# Patient Record
Sex: Male | Born: 1975 | Race: White | Hispanic: No | Marital: Married | Smoking: Never smoker
Health system: Southern US, Community
[De-identification: ages and names within clinical notes are randomized; demographics above are authoritative.]

## PROBLEM LIST (undated history)

## (undated) DIAGNOSIS — R011 Cardiac murmur, unspecified: Secondary | ICD-10-CM

## (undated) DIAGNOSIS — Z87442 Personal history of urinary calculi: Secondary | ICD-10-CM

## (undated) HISTORY — PX: BICEPS TENDON REPAIR: SHX566

---

## 2014-01-08 ENCOUNTER — Other Ambulatory Visit: Payer: Self-pay | Admitting: Sports Medicine

## 2014-01-08 DIAGNOSIS — S53499A Other sprain of unspecified elbow, initial encounter: Secondary | ICD-10-CM

## 2014-01-08 DIAGNOSIS — S56819A Strain of other muscles, fascia and tendons at forearm level, unspecified arm, initial encounter: Principal | ICD-10-CM

## 2014-01-11 ENCOUNTER — Ambulatory Visit
Admission: RE | Admit: 2014-01-11 | Discharge: 2014-01-11 | Disposition: A | Discharge status: Home / Self Care | Payer: 59 | Source: Ambulatory Visit | Attending: Sports Medicine | Admitting: Sports Medicine

## 2014-01-11 DIAGNOSIS — S56819A Strain of other muscles, fascia and tendons at forearm level, unspecified arm, initial encounter: Principal | ICD-10-CM

## 2014-01-11 DIAGNOSIS — S53499A Other sprain of unspecified elbow, initial encounter: Secondary | ICD-10-CM

## 2020-12-21 ENCOUNTER — Other Ambulatory Visit: Payer: Self-pay

## 2020-12-21 ENCOUNTER — Emergency Department (HOSPITAL_BASED_OUTPATIENT_CLINIC_OR_DEPARTMENT_OTHER): Payer: BC Managed Care – PPO

## 2020-12-21 ENCOUNTER — Emergency Department (HOSPITAL_BASED_OUTPATIENT_CLINIC_OR_DEPARTMENT_OTHER)
Admission: EM | Admit: 2020-12-21 | Discharge: 2020-12-22 | Disposition: A | Discharge status: Home / Self Care | Payer: BC Managed Care – PPO | Attending: Emergency Medicine | Admitting: Emergency Medicine

## 2020-12-21 ENCOUNTER — Encounter (HOSPITAL_BASED_OUTPATIENT_CLINIC_OR_DEPARTMENT_OTHER): Payer: Self-pay | Admitting: Urology

## 2020-12-21 DIAGNOSIS — Y92410 Unspecified street and highway as the place of occurrence of the external cause: Secondary | ICD-10-CM | POA: Insufficient documentation

## 2020-12-21 DIAGNOSIS — M546 Pain in thoracic spine: Secondary | ICD-10-CM | POA: Insufficient documentation

## 2020-12-21 DIAGNOSIS — M549 Dorsalgia, unspecified: Secondary | ICD-10-CM

## 2020-12-21 LAB — CBC
HCT: 46.6 % (ref 39.0–52.0)
Hemoglobin: 15.6 g/dL (ref 13.0–17.0)
MCH: 28.6 pg (ref 26.0–34.0)
MCHC: 33.5 g/dL (ref 30.0–36.0)
MCV: 85.3 fL (ref 80.0–100.0)
Platelets: 309 10*3/uL (ref 150–400)
RBC: 5.46 MIL/uL (ref 4.22–5.81)
RDW: 13.2 % (ref 11.5–15.5)
WBC: 10.2 10*3/uL (ref 4.0–10.5)
nRBC: 0 % (ref 0.0–0.2)

## 2020-12-21 LAB — COMPREHENSIVE METABOLIC PANEL
ALT: 36 U/L (ref 0–44)
AST: 31 U/L (ref 15–41)
Albumin: 4.5 g/dL (ref 3.5–5.0)
Alkaline Phosphatase: 84 U/L (ref 38–126)
Anion gap: 10 (ref 5–15)
BUN: 13 mg/dL (ref 6–20)
CO2: 23 mmol/L (ref 22–32)
Calcium: 9.3 mg/dL (ref 8.9–10.3)
Chloride: 104 mmol/L (ref 98–111)
Creatinine, Ser: 1.02 mg/dL (ref 0.61–1.24)
GFR, Estimated: >60 mL/min (ref >60)
Glucose, Bld: 94 mg/dL (ref 70–99)
Potassium: 4 mmol/L (ref 3.5–5.1)
Sodium: 137 mmol/L (ref 135–145)
Total Bilirubin: 0.7 mg/dL (ref 0.3–1.2)
Total Protein: 8.2 g/dL — ABNORMAL HIGH (ref 6.5–8.1)

## 2020-12-21 LAB — URINALYSIS, ROUTINE W REFLEX MICROSCOPIC
Bilirubin Urine: NEGATIVE
Glucose, UA: NEGATIVE mg/dL
Hgb urine dipstick: NEGATIVE
Ketones, ur: NEGATIVE mg/dL
Leukocytes,Ua: NEGATIVE
Nitrite: NEGATIVE
Protein, ur: NEGATIVE mg/dL
Specific Gravity, Urine: 1.025 (ref 1.005–1.030)
pH: 6 (ref 5.0–8.0)

## 2020-12-21 MED ORDER — HYDROCODONE-ACETAMINOPHEN 5-325 MG PO TABS: 2.0000 | ORAL_TABLET | ORAL | 0 refills | Status: AC | PRN

## 2020-12-21 MED ORDER — KETOROLAC TROMETHAMINE 15 MG/ML IJ SOLN
15.0000 mg | Freq: Once | INTRAMUSCULAR | Status: AC
  Administered 2020-12-21: 15 mg via INTRAMUSCULAR
  Filled 2020-12-21: qty 1

## 2020-12-21 MED ORDER — ONDANSETRON HCL 4 MG/2ML IJ SOLN
4.0000 mg | Freq: Once | INTRAMUSCULAR | Status: AC
  Administered 2020-12-21: 4 mg via INTRAVENOUS
  Filled 2020-12-21: qty 2

## 2020-12-21 MED ORDER — CYCLOBENZAPRINE HCL 10 MG PO TABS: 10.0000 mg | ORAL_TABLET | Freq: Three times a day (TID) | ORAL | 0 refills | Status: AC | PRN

## 2020-12-21 MED ORDER — HYDROCODONE-ACETAMINOPHEN 5-325 MG PO TABS
1.0000 | ORAL_TABLET | Freq: Once | ORAL | Status: AC
Start: 2020-12-21 — End: 2020-12-21
  Administered 2020-12-21: 1 via ORAL
  Filled 2020-12-21: qty 1

## 2020-12-21 MED ORDER — MORPHINE SULFATE (PF) 4 MG/ML IV SOLN
4.0000 mg | Freq: Once | INTRAVENOUS | Status: AC
Start: 2020-12-21 — End: 2020-12-21
  Administered 2020-12-21: 4 mg via INTRAVENOUS
  Filled 2020-12-21: qty 1

## 2020-12-21 MED ORDER — IOHEXOL 300 MG/ML  SOLN
75.0000 mL | Freq: Once | INTRAMUSCULAR | Status: AC | PRN
  Administered 2020-12-21: 75 mL via INTRAVENOUS

## 2020-12-21 MED ORDER — DIAZEPAM 5 MG PO TABS
5.0000 mg | ORAL_TABLET | Freq: Once | ORAL | Status: AC
  Administered 2020-12-21: 5 mg via ORAL
  Filled 2020-12-21: qty 1
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
## 2020-12-21 NOTE — Progress Notes (Signed)
Orthopedic Tech Progress Note Patient Details:  Crue Otero Sep 23, 1975 [id number]  Patient ID: Lajuana Matte, male   DOB: 1975-08-25, 45 y.o.   MRN: [id number] I called an order into [facility]. Trinna Post 12/21/2020, 11:06 PM
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
## 2020-12-22 LAB — CK TOTAL AND CKMB (NOT AT ARMC)
CK, MB: 3.2 ng/mL (ref 0.5–5.0)
Relative Index: 0.7 (ref 0.0–2.5)
Total CK: 465 U/L — ABNORMAL HIGH (ref 49–397)

## 2020-12-22 NOTE — ED Notes (Signed)
Brace applied by specialty ortho tech. Provider informed.
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
## 2021-02-11 ENCOUNTER — Encounter: Payer: Self-pay | Admitting: Physical Therapy

## 2021-02-11 ENCOUNTER — Other Ambulatory Visit: Payer: Self-pay

## 2021-02-11 ENCOUNTER — Ambulatory Visit: Payer: BC Managed Care – PPO | Attending: Neurological Surgery | Admitting: Physical Therapy

## 2021-02-11 DIAGNOSIS — M6281 Muscle weakness (generalized): Secondary | ICD-10-CM | POA: Diagnosis present

## 2021-02-11 DIAGNOSIS — M546 Pain in thoracic spine: Secondary | ICD-10-CM | POA: Diagnosis present

## 2021-02-11 DIAGNOSIS — R293 Abnormal posture: Secondary | ICD-10-CM

## 2021-02-11 DIAGNOSIS — M62830 Muscle spasm of back: Secondary | ICD-10-CM

## 2021-02-11 DIAGNOSIS — R2689 Other abnormalities of gait and mobility: Secondary | ICD-10-CM
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
## 2021-02-11 NOTE — Patient Instructions (Signed)
Access Code: [email or web address] URL: [email or web address] Date: 02/11/2021 Prepared by: Harrie Foreman  Exercises Supine Posterior Pelvic Tilt - 1 x daily - 7 x weekly - 2 sets - 10 reps Seated Scapular Retraction - 3 x daily - 7 x weekly - 1 sets - 10 reps Supine Scapular Protraction in Flexion with Dumbbells - 1 x daily - 7 x weekly - 2 sets - 10 reps Shoulder External Rotation and Scapular Retraction with Resistance - 1 x daily - 7 x weekly - 2 sets - 10 reps

## 2021-02-24 ENCOUNTER — Ambulatory Visit: Payer: BC Managed Care – PPO

## 2021-02-24 ENCOUNTER — Other Ambulatory Visit: Payer: Self-pay

## 2021-02-24 DIAGNOSIS — M546 Pain in thoracic spine: Secondary | ICD-10-CM

## 2021-02-24 DIAGNOSIS — M6281 Muscle weakness (generalized): Secondary | ICD-10-CM

## 2021-02-24 DIAGNOSIS — M62830 Muscle spasm of back: Secondary | ICD-10-CM

## 2021-02-24 DIAGNOSIS — R2689 Other abnormalities of gait and mobility: Secondary | ICD-10-CM

## 2021-02-24 DIAGNOSIS — R293 Abnormal posture: Secondary | ICD-10-CM

## 2021-02-24 NOTE — Therapy (Signed)
[facility] Outpatient Rehabilitation [facility] [address]  [address] [place], [place], [address] Phone: [phone]   Fax:  [phone]  Physical Therapy Treatment  Patient Details  Name: Kyle Eaton MRN: [id number] Date of Birth: 1975/12/14 Referring Provider (PT): Autumn Patty MD   Encounter Date: 02/24/2021   PT End of Session - 02/24/21 0937     Visit Number 2    Number of Visits 12    Date for PT Re-Evaluation 03/25/21    Progress Note Due on Visit 10    PT Start Time 0848    PT Stop Time 0928    PT Time Calculation (min) 40 min    Activity Tolerance Patient tolerated treatment well;Patient limited by pain    Behavior During Therapy [facility] for tasks assessed/performed             History reviewed. No pertinent past medical history.  Past Surgical History:  Procedure Laterality Date   BICEPS TENDON REPAIR      There were no vitals filed for this visit.   Subjective Assessment - 02/24/21 0851     Subjective "Low back muscles are continuously sore from not being used".    Diagnostic tests CT    Patient Stated Goals "full recovery, no long term disability"    Currently in Pain? Yes    Pain Score 3     Pain Location Back    Pain Orientation Mid;Right    Pain Descriptors / Indicators Aching;Constant;Sore    Pain Type Acute pain                               [facility] Adult PT Treatment/Exercise - 02/24/21 0001       Exercises   Exercises Lumbar      Lumbar Exercises: Stretches   Active Hamstring Stretch Right;Left;30 seconds    Active Hamstring Stretch Limitations seated    Piriformis Stretch Right;Left;30 seconds    Piriformis Stretch Limitations seated KTOS    Other Lumbar Stretch Exercise fwd rollouts with green pball 10x3"      Lumbar Exercises: Aerobic   Recumbent Bike L1x45min      Lumbar Exercises: Standing   Row Strengthening;Both;Theraband;20 reps    Theraband Level (Row) Level 2 (Red)    Row  Limitations cues for scap retraction    Shoulder Extension Strengthening;Both;20 reps;Theraband    Theraband Level (Shoulder Extension) Level 2 (Red)    Other Standing Lumbar Exercises resisted trunk rotations with red TB 2x10 B    Other Standing Lumbar Exercises B ER with green TB 2x10      Lumbar Exercises: Seated   Long Arc Quad on Chair AROM;Both;10 reps    Other Seated Lumbar Exercises monster walks 10x                     PT Education - 02/24/21 0930     Education Details HEP update, with TBs given    Person(s) Educated Patient    Methods Explanation;Demonstration;Handout    Comprehension Verbalized understanding;Returned demonstration              PT Short Term Goals - 02/24/21 0938       PT SHORT TERM GOAL #1   Title Indepedent with Initial HEP    Time 2    Period Weeks    Status Achieved    Target Date 02/25/21  PT Long Term Goals - 02/24/21 0939       PT LONG TERM GOAL #1   Title Independent with progressed HEP for postural strengthening.    Time 6    Period Weeks    Status On-going      PT LONG TERM GOAL #2   Title Patient will be able to tolerate walking/standing/sitting for 2 hours without TLSO without increased back pain.    Baseline 15 minutes    Time 6    Period Weeks    Status On-going      PT LONG TERM GOAL #3   Title Patient will report no more than 2/10 back pain with performing ADLs/bed mobility/stairs at home without TLSO.    Baseline 6    Period Weeks    Status On-going      PT LONG TERM GOAL #4   Title Patient will be able to lift 25lbs without increased back pain to pick up granddaughter.    Time 6    Period Weeks    Status On-going      PT LONG TERM GOAL #5   Title Patient will be able to lift/carry 50lbs to return to work activities.    Baseline unable    Time 6    Period Weeks    Status On-going                   Plan - 02/24/21 0931     Clinical Impression Statement Pt  reported mild R LBP from recumbent bike warm up. Added rows, extensions to HEP, gave instructions on setup and form with these exercises. No complaints of pain with any of the interventions today but did notice more guarded movement with the fwd lumbar flexion stretch. He mostly noted soreness in his back pre session but post session subjective reports state that he felt much better. He also showed B piriformis and HS tightness today.    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    PT Frequency 2x / week    PT Duration 6 weeks    PT Treatment/Interventions ADLs/Self Care Home Management;Cryotherapy;Electrical Stimulation;Moist Heat;Functional mobility training;Therapeutic activities;Therapeutic exercise;Stair training;Neuromuscular re-education;Patient/family education;Manual techniques;Passive range of motion;Dry needling;Taping;Spinal Manipulations;Joint Manipulations;Aquatic Therapy    PT Next Visit Plan hs, piriformis stretching for LBP; standing Tband exercises, modalities PRN    PT Home Exercise Plan Access Code: [email or web address], (10/26 -update)    Consulted and Agree with Plan of Care Patient             Patient will benefit from skilled therapeutic intervention in order to improve the following deficits and impairments:  Decreased activity tolerance, Decreased endurance, Decreased range of motion, Increased fascial restricitons, Impaired UE functional use, Pain, Decreased mobility, Difficulty walking, Increased muscle spasms, Impaired flexibility, Postural dysfunction  Visit Diagnosis: Acute midline thoracic back pain  Muscle spasm of back  Muscle weakness (generalized)  Abnormal posture  Other abnormalities of gait and mobility     Problem List There are no problems to display for this patient.   Darleene Cleaver, PTA 02/24/2021, 9:40 AM  [facility] [address]   [address] [place], [place], [address] Phone: [phone]   Fax:  [phone]  Name: Kyle Eaton MRN: [id number] Date of Birth: 02-12-1976

## 2021-03-03 ENCOUNTER — Ambulatory Visit: Payer: BC Managed Care – PPO | Attending: Neurological Surgery

## 2021-03-03 ENCOUNTER — Other Ambulatory Visit: Payer: Self-pay

## 2021-03-03 DIAGNOSIS — R2689 Other abnormalities of gait and mobility: Secondary | ICD-10-CM

## 2021-03-03 DIAGNOSIS — M6281 Muscle weakness (generalized): Secondary | ICD-10-CM | POA: Diagnosis present

## 2021-03-03 DIAGNOSIS — M62830 Muscle spasm of back: Secondary | ICD-10-CM | POA: Diagnosis present

## 2021-03-03 DIAGNOSIS — R293 Abnormal posture: Secondary | ICD-10-CM | POA: Diagnosis present

## 2021-03-03 DIAGNOSIS — M546 Pain in thoracic spine: Secondary | ICD-10-CM

## 2021-03-03 NOTE — Therapy (Signed)
[facility] Outpatient Rehabilitation [facility] [address]  [address] [place], [place], [address] Phone: [phone]   Fax:  [phone]  Physical Therapy Treatment  Patient Details  Name: Kyle Eaton MRN: [id number] Date of Birth: 1976/01/20 Referring Provider (PT): Autumn Patty MD   Encounter Date: 03/03/2021   PT End of Session - 03/03/21 1529     Visit Number 3    Number of Visits 12    Date for PT Re-Evaluation 03/25/21    Progress Note Due on Visit 10    PT Start Time 1447    PT Stop Time 1527    PT Time Calculation (min) 40 min    Activity Tolerance Patient tolerated treatment well    Behavior During Therapy [facility] for tasks assessed/performed             History reviewed. No pertinent past medical history.  Past Surgical History:  Procedure Laterality Date   BICEPS TENDON REPAIR      There were no vitals filed for this visit.   Subjective Assessment - 03/03/21 1453     Subjective Feeling good today no pain, but has some days where it hurts more.    Diagnostic tests CT    Patient Stated Goals "full recovery, no long term disability"    Currently in Pain? No/denies                               [facility] Adult PT Treatment/Exercise - 03/03/21 0001       Lumbar Exercises: Stretches   Active Hamstring Stretch Right;Left;30 seconds    Active Hamstring Stretch Limitations seated    Pelvic Tilt 10 reps    Pelvic Tilt Limitations 3" hold    Piriformis Stretch Right;Left;30 seconds    Piriformis Stretch Limitations seated KTOS and figure 4    Other Lumbar Stretch Exercise fwd rollouts with green pball 10x3"    Other Lumbar Stretch Exercise seated lat stretch at counter top 10x3"      Lumbar Exercises: Aerobic   Recumbent Bike L1x63min      Lumbar Exercises: Machines for Strengthening   Other Lumbar Machine Exercise lat pulls standing 20# 2x10; rows wide grip 25# 2x10      Lumbar Exercises: Standing   Other  Standing Lumbar Exercises open books at the wall 10x3"      Lumbar Exercises: Supine   Clam 10 reps;2 seconds    Bridge 10 reps;2 seconds    Bridge Limitations cues for breathing and TrA brace                       PT Short Term Goals - 02/24/21 0938       PT SHORT TERM GOAL #1   Title Indepedent with Initial HEP    Time 2    Period Weeks    Status Achieved    Target Date 02/25/21               PT Long Term Goals - 02/24/21 0939       PT LONG TERM GOAL #1   Title Independent with progressed HEP for postural strengthening.    Time 6    Period Weeks    Status On-going      PT LONG TERM GOAL #2   Title Patient will be able to tolerate walking/standing/sitting for 2 hours without TLSO without increased back pain.  Baseline 15 minutes    Time 6    Period Weeks    Status On-going      PT LONG TERM GOAL #3   Title Patient will report no more than 2/10 back pain with performing ADLs/bed mobility/stairs at home without TLSO.    Baseline 6    Period Weeks    Status On-going      PT LONG TERM GOAL #4   Title Patient will be able to lift 25lbs without increased back pain to pick up granddaughter.    Time 6    Period Weeks    Status On-going      PT LONG TERM GOAL #5   Title Patient will be able to lift/carry 50lbs to return to work activities.    Baseline unable    Time 6    Period Weeks    Status On-going                   Plan - 03/03/21 1530     Clinical Impression Statement Pt responded well to treatment today, had no complaints with exercises. Some cues needed to not push into pain and to relax allowing his back to stretch to reduce pain. Provided education on taking breaks from long road trips to reduce back pain. Some tightness noted B with the open books and with APT.    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    PT Frequency 2x / week    PT Duration 6 weeks    PT  Treatment/Interventions ADLs/Self Care Home Management;Cryotherapy;Electrical Stimulation;Moist Heat;Functional mobility training;Therapeutic activities;Therapeutic exercise;Stair training;Neuromuscular re-education;Patient/family education;Manual techniques;Passive range of motion;Dry needling;Taping;Spinal Manipulations;Joint Manipulations;Aquatic Therapy    PT Next Visit Plan hs, piriformis stretching for LBP; standing Tband exercises, modalities PRN    PT Home Exercise Plan Access Code: [email or web address], (10/26 -update)    Consulted and Agree with Plan of Care Patient             Patient will benefit from skilled therapeutic intervention in order to improve the following deficits and impairments:  Decreased activity tolerance, Decreased endurance, Decreased range of motion, Increased fascial restricitons, Impaired UE functional use, Pain, Decreased mobility, Difficulty walking, Increased muscle spasms, Impaired flexibility, Postural dysfunction  Visit Diagnosis: Acute midline thoracic back pain  Muscle spasm of back  Muscle weakness (generalized)  Abnormal posture  Other abnormalities of gait and mobility     Problem List There are no problems to display for this patient.   Darleene Cleaver, PTA 03/03/2021, 3:55 PM  [facility] [address]  [address] [place], [place], [address] Phone: [phone]   Fax:  [phone]  Name: Kyle Eaton MRN: [id number] Date of Birth: 30-Jan-1976

## 2021-03-05 ENCOUNTER — Other Ambulatory Visit: Payer: Self-pay

## 2021-03-05 ENCOUNTER — Encounter: Payer: Self-pay | Admitting: Physical Therapy

## 2021-03-05 ENCOUNTER — Ambulatory Visit: Payer: BC Managed Care – PPO | Admitting: Physical Therapy

## 2021-03-05 DIAGNOSIS — R2689 Other abnormalities of gait and mobility: Secondary | ICD-10-CM

## 2021-03-05 DIAGNOSIS — M6281 Muscle weakness (generalized): Secondary | ICD-10-CM

## 2021-03-05 DIAGNOSIS — M546 Pain in thoracic spine: Secondary | ICD-10-CM | POA: Diagnosis not present

## 2021-03-05 DIAGNOSIS — R293 Abnormal posture: Secondary | ICD-10-CM

## 2021-03-05 DIAGNOSIS — M62830 Muscle spasm of back: Secondary | ICD-10-CM

## 2021-03-05 NOTE — Therapy (Signed)
[facility] [address]  [place], [place], [address] Phone: [phone]   Fax:  [phone]  Physical Therapy Treatment  Patient Details  Name: Kyle Eaton MRN: [id number] Date of Birth: [birth year] Referring Provider (PT): Emelda Brothers MD   Encounter Date: 03/05/2021   PT End of Session - 03/05/21 0936     Visit Number 4    Number of Visits 12    Date for PT Re-Evaluation 03/25/21    Progress Note Due on Visit 10    PT Start Time 0933    PT Stop Time 1015    PT Time Calculation (min) 42 min    Activity Tolerance Patient tolerated treatment well    Behavior During Therapy [facility] for tasks assessed/performed             History reviewed. No pertinent past medical history.  Past Surgical History:  Procedure Laterality Date   BICEPS TENDON REPAIR      There were no vitals filed for this visit.   Subjective Assessment - 03/05/21 0934     Subjective Patient reports he has weaned self off back brace, only carries it to use if he's doing a lot of heavier work.  He is also using less pain medication, aleve once a day to every other day as needed.  Compliant with exercises.    Diagnostic tests CT    Patient Stated Goals "full recovery, no long term disability"    Currently in Pain? Yes    Pain Score 2     Pain Location Back    Pain Orientation Mid;Right    Pain Descriptors / Indicators Sore                               [facility] Adult PT Treatment/Exercise - 03/05/21 0001       Self-Care   Self-Care ADL's    ADL's technique to decrease pain rolling over in bed - abdominal bracing + using legs/hips to assist      Lumbar Exercises: Aerobic   UBE (Upper Arm Bike) L1 x 6 min (3 min foward/3 min back)   pulling but not pain in upper back     Lumbar Exercises: Standing   Other Standing Lumbar Exercises open books at the wall  x 10 bil    Other Standing Lumbar Exercises wall pushups  2 x 10      Lumbar Exercises: Sidelying   Other Sidelying Lumbar Exercises sidelying open books with elbow flexed 2 x 10 L side only today      Lumbar Exercises: Quadruped   Madcat/Old Horse 20 reps      Shoulder Exercises: Seated   Other Seated Exercises I,Y,T's with 1# weight 2 x 10                     PT Education - 03/05/21 1026     Education Details HEP update for thoracic strengthening/mobilization.  Access Code: [email or web address] Education on bed mobility.    Person(s) Educated Patient    Methods Explanation;Demonstration;Verbal cues;Handout    Comprehension Verbalized understanding;Returned demonstration              PT Short Term Goals - 02/24/21 4431       PT SHORT TERM GOAL #1   Title Indepedent with Initial HEP    Time 2    Period Weeks    Status Achieved  Target Date 02/25/21               PT Long Term Goals - 03/05/21 0937       PT LONG TERM GOAL #1   Title Independent with progressed HEP for postural strengthening.    Time 6    Period Weeks    Status On-going   03/05/21- met for current     PT LONG TERM GOAL #2   Title Patient will be able to tolerate walking/standing/sitting for 2 hours without TLSO without increased back pain.    Baseline 15 minutes    Time 6    Period Weeks    Status Achieved      PT LONG TERM GOAL #3   Title Patient will report no more than 2/10 back pain with performing ADLs/bed mobility/stairs at home without TLSO.    Baseline 6    Period Weeks    Status On-going   still painful rolling over in bed both other ADLs ok     PT LONG TERM GOAL #4   Title Patient will be able to lift 25lbs without increased back pain to pick up granddaughter.    Time 6    Period Weeks    Status On-going   03/05/21- able to pick up carefully     PT LONG TERM GOAL #5   Title Patient will be able to lift/carry 50lbs to return to work activities.    Baseline unable    Time 6    Period Weeks    Status On-going                    Plan - 03/05/21 1019     Clinical Impression Statement Patient is making good progress, tolerating progression of exercises today without complaint of increased pain.  He has weaned self from TLSO and has met LTG #2.  HEP undated to continue strengthening upper back/shoulder stabilizers, and improve thoracic mobility.  He would benefit from continued skilled therapy.    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    PT Frequency 2x / week    PT Duration 6 weeks    PT Treatment/Interventions ADLs/Self Care Home Management;Cryotherapy;Electrical Stimulation;Moist Heat;Functional mobility training;Therapeutic activities;Therapeutic exercise;Stair training;Neuromuscular re-education;Patient/family education;Manual techniques;Passive range of motion;Dry needling;Taping;Spinal Manipulations;Joint Manipulations;Aquatic Therapy    PT Next Visit Plan thoracic strengthenining, piriformis stretching for LBP; standing Tband exercises, modalities PRN    PT Home Exercise Plan Access Code: [email or web address], (10/26 -update)    Consulted and Agree with Plan of Care Patient             Patient will benefit from skilled therapeutic intervention in order to improve the following deficits and impairments:  Decreased activity tolerance, Decreased endurance, Decreased range of motion, Increased fascial restricitons, Impaired UE functional use, Pain, Decreased mobility, Difficulty walking, Increased muscle spasms, Impaired flexibility, Postural dysfunction  Visit Diagnosis: Acute midline thoracic back pain  Muscle spasm of back  Muscle weakness (generalized)  Abnormal posture  Other abnormalities of gait and mobility     Problem List There are no problems to display for this patient.   Rennie Natter, PT, DPT 03/05/2021, 10:28 AM  [facility] [address]  [place],  [place], [address] Phone: [phone]   Fax:  [phone]  Name: Kyle Eaton MRN: [id number] Date of Birth: 1976-04-11

## 2021-03-05 NOTE — Patient Instructions (Signed)
Access Code: [email or web address] URL: [email or web address] Date: 03/05/2021 Prepared by: Harrie Foreman  Exercises Wall Angels - 1 x daily - 7 x weekly - 2 sets - 10 reps Wall Push Up - 1 x daily - 7 x weekly - 2 sets - 10 reps Cat Cow - 1 x daily - 7 x weekly - 2 sets - 10 reps Sidelying Open Book - 1 x daily - 7 x weekly - 2 sets - 10 reps   Not pictured - seated I,Y,T's with 1# weight 2 x 10, standing open book x 10

## 2021-03-10 ENCOUNTER — Encounter: Payer: BC Managed Care – PPO | Admitting: Physical Therapy

## 2021-03-17 ENCOUNTER — Other Ambulatory Visit: Payer: Self-pay

## 2021-03-17 ENCOUNTER — Encounter: Payer: Self-pay | Admitting: Physical Therapy

## 2021-03-17 ENCOUNTER — Ambulatory Visit: Payer: BC Managed Care – PPO | Admitting: Physical Therapy

## 2021-03-17 DIAGNOSIS — M62830 Muscle spasm of back: Secondary | ICD-10-CM

## 2021-03-17 DIAGNOSIS — M6281 Muscle weakness (generalized): Secondary | ICD-10-CM

## 2021-03-17 DIAGNOSIS — M546 Pain in thoracic spine: Secondary | ICD-10-CM

## 2021-03-17 DIAGNOSIS — R293 Abnormal posture: Secondary | ICD-10-CM

## 2021-03-17 DIAGNOSIS — R2689 Other abnormalities of gait and mobility: Secondary | ICD-10-CM

## 2021-03-17 NOTE — Therapy (Signed)
[facility] [address]  [place], [place], [address] Phone: [phone]   Fax:  [phone]  Physical Therapy Treatment  Patient Details  Name: Kyle Eaton MRN: [id number] Date of Birth: 02-21-1976 Referring Provider (PT): Emelda Brothers MD   Encounter Date: 03/17/2021   PT End of Session - 03/17/21 1323     Visit Number 5    Number of Visits 12    Date for PT Re-Evaluation 03/25/21    Progress Note Due on Visit 10    PT Start Time 3358    PT Stop Time 1357    PT Time Calculation (min) 40 min    Activity Tolerance Patient tolerated treatment well    Behavior During Therapy [facility] for tasks assessed/performed             History reviewed. No pertinent past medical history.  Past Surgical History:  Procedure Laterality Date   BICEPS TENDON REPAIR      There were no vitals filed for this visit.   Subjective Assessment - 03/17/21 1321     Subjective Patient reports back is sore today after long car trip to [place] "I think the bumps got to me."  Reports he saw neurosurgeon today who is taking surgery "off the table" since he has been progressing well.    Diagnostic tests CT    Patient Stated Goals "full recovery, no long term disability"    Currently in Pain? Yes    Pain Score 4     Pain Location Back    Pain Descriptors / Indicators Dull                               [facility] Adult PT Treatment/Exercise - 03/17/21 0001       Lumbar Exercises: Stretches   Other Lumbar Stretch Exercise fwd rollouts with green pball 10x3"      Lumbar Exercises: Aerobic   Nustep L5 x 6 min      Lumbar Exercises: Sidelying   Other Sidelying Lumbar Exercises sidelying open books with elbow flexed 2 x 10 L side only today      Lumbar Exercises: Quadruped   Madcat/Old Horse 10 reps    Single Arm Raise 10 reps    Straight Leg Raise 10 reps    Opposite Arm/Leg Raise Right arm/Left leg;Left arm/Right  leg;20 reps    Other Quadruped Lumbar Exercises child pose stretch - lateral also      Manual Therapy   Manual Therapy Joint mobilization;Soft tissue mobilization;Myofascial release    Manual therapy comments to low back in prone to decrease muscle spasm and pain    Joint Mobilization PA mobs to lumbar spine grade 1-2    Soft tissue mobilization IASTM with foam roller to bil glutes, lumbar parapspinals and QL    Myofascial Release MFR to bil QL                       PT Short Term Goals - 02/24/21 0938       PT SHORT TERM GOAL #1   Title Indepedent with Initial HEP    Time 2    Period Weeks    Status Achieved    Target Date 02/25/21               PT Long Term Goals - 03/05/21 0937       PT LONG  TERM GOAL #1   Title Independent with progressed HEP for postural strengthening.    Time 6    Period Weeks    Status On-going   03/05/21- met for current     PT LONG TERM GOAL #2   Title Patient will be able to tolerate walking/standing/sitting for 2 hours without TLSO without increased back pain.    Baseline 15 minutes    Time 6    Period Weeks    Status Achieved      PT LONG TERM GOAL #3   Title Patient will report no more than 2/10 back pain with performing ADLs/bed mobility/stairs at home without TLSO.    Baseline 6    Period Weeks    Status On-going   still painful rolling over in bed both other ADLs ok     PT LONG TERM GOAL #4   Title Patient will be able to lift 25lbs without increased back pain to pick up granddaughter.    Time 6    Period Weeks    Status On-going   03/05/21- able to pick up carefully     PT LONG TERM GOAL #5   Title Patient will be able to lift/carry 50lbs to return to work activities.    Baseline unable    Time 6    Period Weeks    Status On-going                   Plan - 03/17/21 1400     Clinical Impression Statement Patient reports more low back pain today following long car drive, mostly aggravated by  bouncing.  Noted today he also has to walk "softly" to avoid jarring back.  Despite soreness he was able to tolerate progression of exerises today to include bird dogs without pain, manual therapy at end of session to decrease muscle soreness and pain.  He would benefit from continued skilled therapy.    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    PT Frequency 2x / week    PT Duration 6 weeks    PT Treatment/Interventions ADLs/Self Care Home Management;Cryotherapy;Electrical Stimulation;Moist Heat;Functional mobility training;Therapeutic activities;Therapeutic exercise;Stair training;Neuromuscular re-education;Patient/family education;Manual techniques;Passive range of motion;Dry needling;Taping;Spinal Manipulations;Joint Manipulations;Aquatic Therapy    PT Next Visit Plan thoracic strengthenining, piriformis stretching for LBP; standing Tband exercises, modalities PRN    PT Home Exercise Plan Access Code: [email or web address], (10/26 -update)    Consulted and Agree with Plan of Care Patient             Patient will benefit from skilled therapeutic intervention in order to improve the following deficits and impairments:  Decreased activity tolerance, Decreased endurance, Decreased range of motion, Increased fascial restricitons, Impaired UE functional use, Pain, Decreased mobility, Difficulty walking, Increased muscle spasms, Impaired flexibility, Postural dysfunction  Visit Diagnosis: Acute midline thoracic back pain  Muscle spasm of back  Muscle weakness (generalized)  Abnormal posture  Other abnormalities of gait and mobility     Problem List There are no problems to display for this patient.   Rennie Natter, PT, DPT 03/17/2021, 4:09 PM  [facility] [address]  [place], [place], [address] Phone: [phone]   Fax:  [phone]  Name: Galdino Hinchman MRN:  [id number] Date of Birth: 1975-11-27

## 2021-03-19 ENCOUNTER — Ambulatory Visit: Payer: BC Managed Care – PPO | Admitting: Physical Therapy

## 2021-03-19 ENCOUNTER — Encounter: Payer: Self-pay | Admitting: Physical Therapy

## 2021-03-19 ENCOUNTER — Other Ambulatory Visit: Payer: Self-pay

## 2021-03-19 DIAGNOSIS — M546 Pain in thoracic spine: Secondary | ICD-10-CM | POA: Diagnosis not present

## 2021-03-19 DIAGNOSIS — M62830 Muscle spasm of back: Secondary | ICD-10-CM

## 2021-03-19 DIAGNOSIS — R293 Abnormal posture: Secondary | ICD-10-CM

## 2021-03-19 DIAGNOSIS — M6281 Muscle weakness (generalized): Secondary | ICD-10-CM

## 2021-03-19 DIAGNOSIS — R2689 Other abnormalities of gait and mobility: Secondary | ICD-10-CM

## 2021-03-19 NOTE — Therapy (Signed)
[facility] [address]  [place], [place], [address] Phone: [phone]   Fax:  [phone]  Physical Therapy Treatment  Patient Details  Name: Kyle Eaton MRN: [id number] Date of Birth: 02/18/1976 Referring Provider (PT): Emelda Brothers MD   Encounter Date: 03/19/2021   PT End of Session - 03/19/21 0852     Visit Number 6    Number of Visits 12    Date for PT Re-Evaluation 03/25/21    Progress Note Due on Visit 10    PT Start Time 0850    PT Stop Time 0928    PT Time Calculation (min) 38 min    Activity Tolerance Patient tolerated treatment well    Behavior During Therapy [facility] for tasks assessed/performed             History reviewed. No pertinent past medical history.  Past Surgical History:  Procedure Laterality Date   BICEPS TENDON REPAIR      There were no vitals filed for this visit.   Subjective Assessment - 03/19/21 0851     Subjective Patient reports back feels tight this morning.    Diagnostic tests CT    Patient Stated Goals "full recovery, no long term disability"    Currently in Pain? No/denies                               [facility] Adult PT Treatment/Exercise - 03/19/21 0001       Exercises   Exercises Shoulder;Lumbar      Lumbar Exercises: Aerobic   Nustep L5 x 6 min      Lumbar Exercises: Standing   Functional Squats Limitations 3 x 10 with weighted blue ball press out      Lumbar Exercises: Seated   Other Seated Lumbar Exercises seated dead lifts with blue ball 2 x 10      Lumbar Exercises: Quadruped   Opposite Arm/Leg Raise Right arm/Left leg;Left arm/Right leg;20 reps    Opposite Arm/Leg Raise Limitations first round just straight bird dogs alternating, second set progressing to crunch and extend 10x each side    Other Quadruped Lumbar Exercises thread the needle x 10 bil      Shoulder Exercises: ROM/Strengthening   Lat Pull Limitations 15# 1  x 10, 20# 2 x 10    Cybex Press Limitations 15 1 x 10, 20# 1 x 10    Cybex Row Limitations 15# 1 x 10, 20# 1 x 10, 25# 1 x 10                       PT Short Term Goals - 02/24/21 0938       PT SHORT TERM GOAL #1   Title Indepedent with Initial HEP    Time 2    Period Weeks    Status Achieved    Target Date 02/25/21               PT Long Term Goals - 03/19/21 4401       PT LONG TERM GOAL #1   Title Independent with progressed HEP for postural strengthening.    Time 6    Period Weeks    Status On-going   03/05/21- met for current     PT LONG TERM GOAL #2   Title Patient will be able to tolerate walking/standing/sitting for 2 hours without TLSO without increased back  pain.    Baseline 15 minutes    Time 6    Period Weeks    Status Achieved      PT LONG TERM GOAL #3   Title Patient will report no more than 2/10 back pain with performing ADLs/bed mobility/stairs at home without TLSO.    Baseline 6    Period Weeks    Status On-going   still painful rolling over in bed both other ADLs ok     PT LONG TERM GOAL #4   Title Patient will be able to lift 25lbs without increased back pain to pick up granddaughter.    Time 6    Period Weeks    Status Achieved   03/05/21- able to pick up carefully  11/18- able to consistently pick up granddaughter without trouble.     PT LONG TERM GOAL #5   Title Patient will be able to lift/carry 50lbs to return to work activities.    Baseline unable    Time 6    Period Weeks    Status On-going                   Plan - 03/19/21 0929     Clinical Impression Statement Patient was able to tolerate progression of exercises today to include dead lifts and weight machines with line resistance, with no report of pain with exercises.  Cues throughout for form and technique, especially squats.  He reported no pain and decreased tightness at end of session.  He would benefit from continued skilled therapy to return to all  activities.    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    PT Frequency 2x / week    PT Duration 6 weeks    PT Treatment/Interventions ADLs/Self Care Home Management;Cryotherapy;Electrical Stimulation;Moist Heat;Functional mobility training;Therapeutic activities;Therapeutic exercise;Stair training;Neuromuscular re-education;Patient/family education;Manual techniques;Passive range of motion;Dry needling;Taping;Spinal Manipulations;Joint Manipulations;Aquatic Therapy    PT Next Visit Plan continued progressive strengthening program, modalities PRN    PT Home Exercise Plan Access Code: [email or web address], (10/26 -update)    Consulted and Agree with Plan of Care Patient             Patient will benefit from skilled therapeutic intervention in order to improve the following deficits and impairments:  Decreased activity tolerance, Decreased endurance, Decreased range of motion, Increased fascial restricitons, Impaired UE functional use, Pain, Decreased mobility, Difficulty walking, Increased muscle spasms, Impaired flexibility, Postural dysfunction  Visit Diagnosis: Acute midline thoracic back pain  Muscle spasm of back  Muscle weakness (generalized)  Abnormal posture  Other abnormalities of gait and mobility     Problem List There are no problems to display for this patient.   Rennie Natter, PT, DPT 03/19/2021, 9:34 AM  [facility] [address]  [place], [place], [address] Phone: [phone]   Fax:  [phone]  Name: Kyle Eaton MRN: [id number] Date of Birth: 07-08-1975

## 2021-03-22 ENCOUNTER — Other Ambulatory Visit: Payer: Self-pay

## 2021-03-22 ENCOUNTER — Ambulatory Visit: Payer: BC Managed Care – PPO | Admitting: Physical Therapy

## 2021-03-22 ENCOUNTER — Encounter: Payer: Self-pay | Admitting: Physical Therapy

## 2021-03-22 DIAGNOSIS — R293 Abnormal posture: Secondary | ICD-10-CM

## 2021-03-22 DIAGNOSIS — M62830 Muscle spasm of back: Secondary | ICD-10-CM

## 2021-03-22 DIAGNOSIS — M546 Pain in thoracic spine: Secondary | ICD-10-CM | POA: Diagnosis not present

## 2021-03-22 DIAGNOSIS — M6281 Muscle weakness (generalized): Secondary | ICD-10-CM

## 2021-03-22 DIAGNOSIS — R2689 Other abnormalities of gait and mobility: Secondary | ICD-10-CM

## 2021-03-22 NOTE — Therapy (Signed)
[facility] [address]  [place], [place], [address] Phone: [phone]   Fax:  [phone]  Physical Therapy Treatment  Patient Details  Name: Kyle Eaton MRN: [id number] Date of Birth: October 01, 1975 Referring Provider (PT): Emelda Brothers MD   Encounter Date: 03/22/2021   PT End of Session - 03/22/21 0939     Visit Number 7    Number of Visits 12    Date for PT Re-Evaluation 03/25/21    Progress Note Due on Visit 10    PT Start Time 0937    PT Stop Time 1015    PT Time Calculation (min) 38 min    Activity Tolerance Patient tolerated treatment well    Behavior During Therapy [facility] for tasks assessed/performed             History reviewed. No pertinent past medical history.  Past Surgical History:  Procedure Laterality Date   BICEPS TENDON REPAIR      There were no vitals filed for this visit.   Subjective Assessment - 03/22/21 0938     Subjective Patient reports he is doing well, but back feels stiff and feels like muscle is knotted up.  Most of the time as goes through day it loosens up by lunch time.  Sore after last session.    Diagnostic tests CT    Patient Stated Goals "full recovery, no long term disability"    Currently in Pain? No/denies                               [facility] Adult PT Treatment/Exercise - 03/22/21 0001       Exercises   Exercises Shoulder;Lumbar      Lumbar Exercises: Standing   Other Standing Lumbar Exercises standing dead lift 10# hand weight 3 x 10    Other Standing Lumbar Exercises RDLs 2 x 10 bil      Shoulder Exercises: ROM/Strengthening   UBE (Upper Arm Bike) L3 x 6 min (3 foward, 3 back) for warm-up    Lat Pull Limitations 20# 3 x 10    Cybex Press Limitations 20# 3 x 10    Cybex Row Limitations 20# 3 x 10    Other ROM/Strengthening Exercises Paloff press with side step 2 x 10 bil with 10# resistance. Bicep curls standing 10# 3 x 10 with  cues for TrA contraction.                       PT Short Term Goals - 02/24/21 0938       PT SHORT TERM GOAL #1   Title Indepedent with Initial HEP    Time 2    Period Weeks    Status Achieved    Target Date 02/25/21               PT Long Term Goals - 03/19/21 0852       PT LONG TERM GOAL #1   Title Independent with progressed HEP for postural strengthening.    Time 6    Period Weeks    Status On-going   03/05/21- met for current     PT LONG TERM GOAL #2   Title Patient will be able to tolerate walking/standing/sitting for 2 hours without TLSO without increased back pain.    Baseline 15 minutes    Time 6    Period Weeks  Status Achieved      PT LONG TERM GOAL #3   Title Patient will report no more than 2/10 back pain with performing ADLs/bed mobility/stairs at home without TLSO.    Baseline 6    Period Weeks    Status On-going   still painful rolling over in bed both other ADLs ok     PT LONG TERM GOAL #4   Title Patient will be able to lift 25lbs without increased back pain to pick up granddaughter.    Time 6    Period Weeks    Status Achieved   03/05/21- able to pick up carefully  11/18- able to consistently pick up granddaughter without trouble.     PT LONG TERM GOAL #5   Title Patient will be able to lift/carry 50lbs to return to work activities.    Baseline unable    Time 6    Period Weeks    Status On-going                   Plan - 03/22/21 0939     Clinical Impression Statement Patient did report muscle soreness for a couple days after last session but no pain.  Today he tolerated progressex ercises incluidng weight machines again (did not advance weight this session) with addition of Paloff press, standing deal lifts and RDLs, reported feeling less stiffness and no pain at end of session.  He would benefit from continued skilled therapy.    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    PT Frequency 2x / week    PT Duration 6 weeks    PT Treatment/Interventions ADLs/Self Care Home Management;Cryotherapy;Electrical Stimulation;Moist Heat;Functional mobility training;Therapeutic activities;Therapeutic exercise;Stair training;Neuromuscular re-education;Patient/family education;Manual techniques;Passive range of motion;Dry needling;Taping;Spinal Manipulations;Joint Manipulations;Aquatic Therapy    PT Next Visit Plan continued progressive strengthening program, modalities PRN    PT Home Exercise Plan Access Code: [email or web address], (10/26 -update)    Consulted and Agree with Plan of Care Patient             Patient will benefit from skilled therapeutic intervention in order to improve the following deficits and impairments:  Decreased activity tolerance, Decreased endurance, Decreased range of motion, Increased fascial restricitons, Impaired UE functional use, Pain, Decreased mobility, Difficulty walking, Increased muscle spasms, Impaired flexibility, Postural dysfunction  Visit Diagnosis: Acute midline thoracic back pain  Muscle spasm of back  Muscle weakness (generalized)  Abnormal posture  Other abnormalities of gait and mobility     Problem List There are no problems to display for this patient.   Rennie Natter, PT, DPT 03/22/2021, 11:58 AM  [facility] [address]  [place], [place], [address] Phone: [phone]   Fax:  [phone]  Name: Kyle Eaton MRN: [id number] Date of Birth: Aug 13, 1975

## 2021-03-24 ENCOUNTER — Ambulatory Visit: Payer: BC Managed Care – PPO | Admitting: Physical Therapy

## 2021-03-24 ENCOUNTER — Other Ambulatory Visit: Payer: Self-pay

## 2021-03-24 ENCOUNTER — Encounter: Payer: Self-pay | Admitting: Physical Therapy

## 2021-03-24 DIAGNOSIS — M546 Pain in thoracic spine: Secondary | ICD-10-CM | POA: Diagnosis not present

## 2021-03-24 DIAGNOSIS — M62830 Muscle spasm of back: Secondary | ICD-10-CM

## 2021-03-24 DIAGNOSIS — R293 Abnormal posture: Secondary | ICD-10-CM

## 2021-03-24 DIAGNOSIS — M6281 Muscle weakness (generalized): Secondary | ICD-10-CM

## 2021-03-24 DIAGNOSIS — R2689 Other abnormalities of gait and mobility: Secondary | ICD-10-CM

## 2021-03-24 NOTE — Therapy (Addendum)
[facility] [address]  [place], [place], [address] Phone: [phone]   Fax:  [phone]  Physical Therapy Treatment/Progress Note  Progress Note Reporting Period 02/11/2021 to 03/24/2021  See note below for Objective Data and Assessment of Progress/Goals.     Patient Details  Name: Kyle Eaton MRN: [id number] Date of Birth: 07-19-1975 Referring Provider (PT): Emelda Brothers MD   Encounter Date: 03/24/2021   PT End of Session - 03/24/21 0937     Visit Number 8    Number of Visits 14    Date for PT Re-Evaluation 05/05/21    Progress Note Due on Visit 10    PT Start Time 0932    PT Stop Time 1015    PT Time Calculation (min) 43 min    Activity Tolerance Patient tolerated treatment well    Behavior During Therapy [facility] for tasks assessed/performed             History reviewed. No pertinent past medical history.  Past Surgical History:  Procedure Laterality Date   BICEPS TENDON REPAIR      There were no vitals filed for this visit.   Subjective Assessment - 03/24/21 0936     Subjective Patient reports hamstring soreness with diver's exercises.  Back is doing well.    Currently in Pain? No/denies                [facility] PT Assessment - 03/24/21 0001       Assessment   Medical Diagnosis closed fracture of thoracic vertebra    Referring Provider (PT) Emelda Brothers MD    Onset Date/Surgical Date 12/20/20    Hand Dominance Right      Precautions   Precautions None      Restrictions   Weight Bearing Restrictions No      Observation/Other Assessments   Focus on Therapeutic Outcomes (FOTO)  thoracic 61   predicted 60 at discharge.                          [facility] Adult PT Treatment/Exercise - 03/24/21 0001       Ambulation/Gait   Stairs Yes    Stairs Assistance 6: Modified independent (Device/Increase time)    Stair Management Technique One rail  Right;Alternating pattern    Number of Stairs 30    Height of Stairs 8      Self-Care   Self-Care Lifting    Lifting education on safety with lifting, golfer's lift, and ADLs      Therapeutic Activites    Therapeutic Activities Lifting    Lifting Demo and return demo for floor to table transfer of 17.5# box, followed by carrying box x 200', then floor to counter height transfers 22.5# x 10.  Education on [employer], with return demo picking up items from floor with counter support x 10 bil.  No pain with activities. Cues throughout for safety.      Exercises   Exercises Shoulder;Lumbar      Lumbar Exercises: Standing   Other Standing Lumbar Exercises RDLs x 10 bil      Shoulder Exercises: ROM/Strengthening   UBE (Upper Arm Bike) L3 x 6 min (3 foward, 3 back) for warm-up      Manual Therapy   Manual Therapy Soft tissue mobilization    Manual therapy comments to hamstrings to decrease soreness/pain in prone    Soft tissue mobilization IASTM with massage stick  to bil hamstrings, decreased pain reported afterwards.                     PT Education - 03/24/21 1043     Education Details education on progress, recommendations for progressing program, recommendations for muscle soreness (continued gentle stretching, walking, increase fluids, decrease exercise to every other day).  Education on safety with lifting.    Person(s) Educated Patient    Methods Explanation;Demonstration;Verbal cues    Comprehension Verbalized understanding;Returned demonstration              PT Short Term Goals - 02/24/21 0938       PT SHORT TERM GOAL #1   Title Indepedent with Initial HEP    Time 2    Period Weeks    Status Achieved    Target Date 02/25/21               PT Long Term Goals - 03/24/21 2426       PT LONG TERM GOAL #1   Title Independent with progressed HEP for postural strengthening.    Time 6    Period Weeks    Status On-going   03/05/21- met for current   03/24/21- good compliance, continue to progress.   Target Date 05/05/21      PT LONG TERM GOAL #2   Title Patient will be able to tolerate walking/standing/sitting for 2 hours without TLSO without increased back pain.    Baseline 15 minutes    Time 6    Period Weeks    Status Achieved      PT LONG TERM GOAL #3   Title Patient will report no more than 2/10 back pain with performing ADLs/bed mobility/stairs at home without TLSO.    Baseline 6    Period Weeks    Status Achieved   03/24/21- no pain, just soreness, occasional muscle spasms     PT LONG TERM GOAL #4   Title Patient will be able to lift 25lbs without increased back pain to pick up granddaughter.    Time 6    Period Weeks    Status Achieved   03/05/21- able to pick up carefully  11/18- able to consistently pick up granddaughter without trouble.     PT LONG TERM GOAL #5   Title Patient will be able to lift/carry 50lbs to return to work activities.    Baseline unable    Time 6    Period Weeks    Status On-going   03/24/21- able to lift 22lbs without pain   Target Date 05/05/21                   Plan - 03/24/21 1018     Clinical Impression Statement Patient is making good progress towards all goals, but still limited with lifting.  Today focused on safety with lifting and increasing weight, he reports not feeling comfortable lifting more than 22lbs in clinic today, but no pain with lifting this amount.  His FOTO has improved to 61%, and he reports no limitation now with ADLs, bed mobility, or sleeping.  However he is still limited with work duties and has difficulty walking longer periods.  He would benefit from continued skilled therapy to be able to return to PLOF and all work duties without restriction, recommended continued therapy for additonal 6 weeks 1x/week to transition to gym based exercise program.    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    PT  Frequency 1x / week    PT Duration 6 weeks    PT Treatment/Interventions ADLs/Self Care Home Management;Cryotherapy;Electrical Stimulation;Moist Heat;Functional mobility training;Therapeutic activities;Therapeutic exercise;Stair training;Neuromuscular re-education;Patient/family education;Manual techniques;Passive range of motion;Dry needling;Taping;Spinal Manipulations;Joint Manipulations;Aquatic Therapy    PT Next Visit Plan continued progressive strengthening program, modalities PRN    PT Home Exercise Plan Access Code: [email or web address], (10/26 -update)    Consulted and Agree with Plan of Care Patient             Patient will benefit from skilled therapeutic intervention in order to improve the following deficits and impairments:  Decreased activity tolerance, Decreased endurance, Decreased range of motion, Increased fascial restricitons, Impaired UE functional use, Pain, Decreased mobility, Difficulty walking, Increased muscle spasms, Impaired flexibility, Postural dysfunction  Visit Diagnosis: Acute midline thoracic back pain - Plan: PT plan of care cert/re-cert  Muscle spasm of back - Plan: PT plan of care cert/re-cert  Muscle weakness (generalized) - Plan: PT plan of care cert/re-cert  Abnormal posture - Plan: PT plan of care cert/re-cert  Other abnormalities of gait and mobility - Plan: PT plan of care cert/re-cert     Problem List There are no problems to display for this patient.   Rennie Natter, PT, DPT 03/24/2021, 10:45 AM  [facility] [address]  [place], [place], [address] Phone: [phone]   Fax:  [phone]  Name: Woodard Perrell MRN: [id number] Date of Birth: [birth year]

## 2021-03-31 ENCOUNTER — Ambulatory Visit: Payer: BC Managed Care – PPO

## 2021-03-31 ENCOUNTER — Encounter: Payer: Self-pay | Admitting: Physical Therapy

## 2021-03-31 ENCOUNTER — Other Ambulatory Visit: Payer: Self-pay

## 2021-03-31 DIAGNOSIS — M6281 Muscle weakness (generalized): Secondary | ICD-10-CM

## 2021-03-31 DIAGNOSIS — R2689 Other abnormalities of gait and mobility: Secondary | ICD-10-CM

## 2021-03-31 DIAGNOSIS — M62830 Muscle spasm of back: Secondary | ICD-10-CM

## 2021-03-31 DIAGNOSIS — M546 Pain in thoracic spine: Secondary | ICD-10-CM | POA: Diagnosis not present

## 2021-03-31 DIAGNOSIS — R293 Abnormal posture: Secondary | ICD-10-CM

## 2021-03-31 NOTE — Therapy (Signed)
[facility] [address]  [place], [place], [address] Phone: [phone]   Fax:  [phone]  Physical Therapy Treatment  Patient Details  Name: Kyle Eaton MRN: [id number] Date of Birth: 01-Oct-1975 Referring Provider (PT): Emelda Brothers MD   Encounter Date: 03/31/2021   PT End of Session - 03/31/21 1012     Visit Number 9    Number of Visits 14    Date for PT Re-Evaluation 05/05/21    Progress Note Due on Visit 10    PT Start Time 0934    PT Stop Time 1012    PT Time Calculation (min) 38 min    Activity Tolerance Patient tolerated treatment well    Behavior During Therapy [facility] for tasks assessed/performed             History reviewed. No pertinent past medical history.  Past Surgical History:  Procedure Laterality Date   BICEPS TENDON REPAIR      There were no vitals filed for this visit.   Subjective Assessment - 03/31/21 0937     Subjective "Exercises are going well at home, start with them in the morning and they keep me loose all day."    Diagnostic tests CT    Patient Stated Goals "full recovery, no long term disability"    Currently in Pain? Yes    Pain Score 1     Pain Location Back    Pain Orientation Mid;Lower    Pain Descriptors / Indicators Constant;Sore    Pain Type Chronic pain                               [facility] Adult PT Treatment/Exercise - 03/31/21 0001       Therapeutic Activites    Therapeutic Activities Lifting    Lifting 25 lb box around the clinic, progressed to 30lb box 1x around clinic      Exercises   Exercises Lumbar      Lumbar Exercises: Stretches   Active Hamstring Stretch Right;Left;2 reps;30 seconds    Active Hamstring Stretch Limitations seated    Standing Extension 10 reps    Standing Extension Limitations 3 sec pause for stretch      Lumbar Exercises: Aerobic   UBE (Upper Arm Bike) L2.0 3 min fwd/ 3 min back      Lumbar  Exercises: Machines for Strengthening   Leg Press 35lb 2x10    Other Lumbar Machine Exercise standing lat pull 20lb, seated lat pull 25lb 10 reps each   showed alt versions to do at gym     Lumbar Exercises: Standing   Row Strengthening;Both;Theraband;20 reps    Theraband Level (Row) Level 3 (Green)    Shoulder Extension Strengthening;Both;20 reps;Theraband    Theraband Level (Shoulder Extension) Level 3 (Green)    Other Standing Lumbar Exercises hip hinges (stiff leg DL) 15 reps no weight to warm up    Other Standing Lumbar Exercises 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    Time 6    Period Weeks    Status On-going   03/05/21- met for current  03/24/21- good compliance, continue to progress.     PT LONG TERM GOAL #2   Title Patient will be able to tolerate walking/standing/sitting for 2 hours without TLSO without increased back pain.    Baseline 15 minutes    Time 6    Period Weeks    Status Achieved      PT LONG TERM GOAL #3   Title Patient will report no more than 2/10 back pain with performing ADLs/bed mobility/stairs at home without TLSO.    Baseline 6    Period Weeks    Status Achieved   03/24/21- no pain, just soreness, occasional muscle spasms     PT LONG TERM GOAL #4   Title Patient will be able to lift 25lbs without increased back pain to pick up granddaughter.    Time 6    Period Weeks    Status Achieved   03/05/21- able to pick up carefully  11/18- able to consistently pick up granddaughter without trouble.     PT LONG TERM GOAL #5   Title Patient will be able to lift/carry 50lbs  to return to work activities.    Baseline unable    Time 6    Period Weeks    Status On-going   03/31/21- able to lift 30lbs without pain                  Plan - 03/31/21 1014     Clinical Impression Statement Pt is progressing well with work related activites, able to carry 30lb box around clinic with no back pain. Reviewed gym equipment and exercises, gave instructions on set up to avoid any excessive strain on joints, doing light resistance to avoid injury. Progressed home program, scap stab exercises with green TB today. No limitations of pain with any of the interventions today, he is progressing welll.    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    PT Frequency 1x / week    PT Duration 6 weeks    PT Treatment/Interventions ADLs/Self Care Home Management;Cryotherapy;Electrical Stimulation;Moist Heat;Functional mobility training;Therapeutic activities;Therapeutic exercise;Stair training;Neuromuscular re-education;Patient/family education;Manual techniques;Passive range of motion;Dry needling;Taping;Spinal Manipulations;Joint Manipulations;Aquatic Therapy    PT Next Visit Plan continued progressive strengthening program, modalities PRN    PT Home Exercise Plan Access Code: [email or web address], (10/26 -update)    Consulted and Agree with Plan of Care Patient             Patient will benefit from skilled therapeutic intervention in order to improve the following deficits and impairments:  Decreased activity tolerance, Decreased endurance, Decreased range of motion, Increased fascial restricitons, Impaired UE functional use, Pain, Decreased mobility, Difficulty walking, Increased muscle spasms, Impaired flexibility, Postural dysfunction  Visit Diagnosis: Acute midline thoracic back pain  Muscle spasm of back  Muscle weakness (generalized)  Abnormal posture  Other abnormalities of gait and mobility     Problem List There are  no problems to display for this patient.   Artist Pais, PTA 03/31/2021, 12:08 PM  [facility] [address]  [place], [place], [address] Phone: [phone]   Fax:  [phone]  Name: Kyle Eaton MRN: [id number] Date of Birth: 01-15-1976

## 2021-04-02 ENCOUNTER — Encounter: Payer: BC Managed Care – PPO | Admitting: Physical Therapy

## 2021-04-09 ENCOUNTER — Other Ambulatory Visit: Payer: Self-pay

## 2021-04-09 ENCOUNTER — Ambulatory Visit: Payer: BC Managed Care – PPO | Attending: Neurological Surgery | Admitting: Physical Therapy

## 2021-04-09 DIAGNOSIS — M546 Pain in thoracic spine: Secondary | ICD-10-CM | POA: Insufficient documentation

## 2021-04-09 DIAGNOSIS — R293 Abnormal posture: Secondary | ICD-10-CM | POA: Insufficient documentation

## 2021-04-09 DIAGNOSIS — M6281 Muscle weakness (generalized): Secondary | ICD-10-CM | POA: Diagnosis present

## 2021-04-09 DIAGNOSIS — R2689 Other abnormalities of gait and mobility: Secondary | ICD-10-CM | POA: Diagnosis present

## 2021-04-09 DIAGNOSIS — M62830 Muscle spasm of back: Secondary | ICD-10-CM | POA: Diagnosis present

## 2021-04-09 NOTE — Therapy (Signed)
[facility] [address]  [place], [place], [address] Phone: [phone]   Fax:  [phone]  Physical Therapy Treatment  Patient Details  Name: Kyle Eaton MRN: [id number] Date of Birth: [birth year] Referring Provider (PT): Emelda Brothers MD   Encounter Date: 04/09/2021    No past medical history on file.  Past Surgical History:  Procedure Laterality Date   BICEPS TENDON REPAIR      There were no vitals filed for this visit.   Subjective Assessment - 04/09/21 0850     Subjective Pt reports that everything is progress at this point. Just a little stiff this morning.    Limitations Sitting;Walking;Lifting;Standing;House hold activities    How long can you sit comfortably? couple hours with brace    How long can you stand comfortably? with brace couple hours, without brace 15 min    How long can you walk comfortably? with brace 20 minutes carefully, trouble going unp and down stairs    Diagnostic tests CT    Patient Stated Goals "full recovery, no long term disability"    Currently in Pain? Yes    Pain Score 1     Pain Location Back    Pain Descriptors / Indicators --   Stiff   Pain Type Chronic pain                [facility] PT Assessment - 04/09/21 0001       Assessment   Medical Diagnosis closed fracture of thoracic vertebra    Referring Provider (PT) Emelda Brothers MD    Onset Date/Surgical Date 12/20/20    Hand Dominance Right                           [facility] Adult PT Treatment/Exercise - 04/09/21 0001       Therapeutic Activites    Therapeutic Activities Lifting    Lifting 30 lb in box x2 trips around track. Lift on to counter x5. 35 lb in box lift on to counter x 5.      Lumbar Exercises: Aerobic   UBE (Upper Arm Bike) L3.0 2 min fwd/ 2 min back      Lumbar Exercises: Machines for Strengthening   Leg Press 55lb x10, 75lbs x10,    Other Lumbar Machine Exercise  standing lat pull 20lb, seated lat pull 25lb 10 reps each      Lumbar Exercises: Standing   Other Standing Lumbar Exercises Dead lift 10# 3x10    Other Standing Lumbar Exercises Palloff press blue tband 3x10; anti rotation with side stepping 2x1 minute blue tband      Shoulder Exercises: ROM/Strengthening   Lat Pull Limitations 35# 3x10    Cybex Row Limitations low & high row 35# 3x10                     PT Education - 04/09/21 0921     Education Details Discussed weight lifting belt for increased safety at work to help provide more support since he has to lift such heavy weight consistently.    Person(s) Educated Patient    Methods Explanation;Demonstration;Tactile cues;Verbal cues;Handout    Comprehension Verbalized understanding;Returned demonstration;Verbal cues required;Tactile cues required              PT Short Term Goals - 02/24/21 0938       PT SHORT TERM GOAL #1   Title Indepedent with Initial HEP  Time 2    Period Weeks    Status Achieved    Target Date 02/25/21               PT Long Term Goals - 03/31/21 1032       PT LONG TERM GOAL #1   Title Independent with progressed HEP for postural strengthening.    Time 6    Period Weeks    Status On-going   03/05/21- met for current  03/24/21- good compliance, continue to progress.     PT LONG TERM GOAL #2   Title Patient will be able to tolerate walking/standing/sitting for 2 hours without TLSO without increased back pain.    Baseline 15 minutes    Time 6    Period Weeks    Status Achieved      PT LONG TERM GOAL #3   Title Patient will report no more than 2/10 back pain with performing ADLs/bed mobility/stairs at home without TLSO.    Baseline 6    Period Weeks    Status Achieved   03/24/21- no pain, just soreness, occasional muscle spasms     PT LONG TERM GOAL #4   Title Patient will be able to lift 25lbs without increased back pain to pick up granddaughter.    Time 6    Period Weeks     Status Achieved   03/05/21- able to pick up carefully  11/18- able to consistently pick up granddaughter without trouble.     PT LONG TERM GOAL #5   Title Patient will be able to lift/carry 50lbs to return to work activities.    Baseline unable    Time 6    Period Weeks    Status On-going   03/31/21- able to lift 30lbs without pain                  Plan - 04/09/21 0859     Clinical Impression Statement Treatment session focused on progressing strengthening with machine weights. Pt able to demo good form and tolerance to increasing weight. Worked on functional lifting and carrying tasks at 35 lbs in box this session. No pain with treatment noted. Discussed other functional activities he has to perform at work -- pt states he has to climb ladders with equipment sometimes. Discussed trying to practice holding/carrying weight in one hand and performing high steps to try and replicate.    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 Consider ways to work on strengthening for safe return for pt to lift/carry weight on a ladder.    PT Home Exercise Plan Access Code: [email or web address], (10/26 -update)    Consulted and Agree with Plan of Care Patient             Patient will benefit from skilled therapeutic intervention in order to improve the following deficits and impairments:  Decreased activity tolerance, Decreased endurance, Decreased range of motion,  Increased fascial restricitons, Impaired UE functional use, Pain, Decreased mobility, Difficulty walking, Increased muscle spasms, Impaired flexibility, Postural dysfunction  Visit Diagnosis: Acute midline thoracic back pain  Muscle spasm of back  Muscle weakness (generalized)  Abnormal posture  Other abnormalities of gait and mobility     Problem List There are no problems to display for this patient.   [facility] April Gordy Levan, PT, DPT 04/09/2021, 10:38 AM  [facility] [address]  [place], [place], [address] Phone: [phone]   Fax:  [phone]  Name: Kyle Eaton MRN: [id number] Date of Birth: 1975-08-18

## 2021-04-13 ENCOUNTER — Ambulatory Visit: Payer: BC Managed Care – PPO | Admitting: Physical Therapy

## 2021-04-20 ENCOUNTER — Ambulatory Visit: Payer: BC Managed Care – PPO | Admitting: Physical Therapy

## 2021-05-05 ENCOUNTER — Other Ambulatory Visit: Payer: Self-pay

## 2021-05-05 ENCOUNTER — Ambulatory Visit: Payer: BC Managed Care – PPO | Attending: Neurological Surgery | Admitting: Physical Therapy

## 2021-05-05 ENCOUNTER — Encounter: Payer: Self-pay | Admitting: Physical Therapy

## 2021-05-05 DIAGNOSIS — M62830 Muscle spasm of back: Secondary | ICD-10-CM | POA: Insufficient documentation

## 2021-05-05 DIAGNOSIS — M546 Pain in thoracic spine: Secondary | ICD-10-CM | POA: Diagnosis present

## 2021-05-05 DIAGNOSIS — M6281 Muscle weakness (generalized): Secondary | ICD-10-CM | POA: Diagnosis present

## 2021-05-05 DIAGNOSIS — R293 Abnormal posture: Secondary | ICD-10-CM | POA: Diagnosis present

## 2021-05-05 DIAGNOSIS — R2689 Other abnormalities of gait and mobility: Secondary | ICD-10-CM | POA: Insufficient documentation

## 2021-05-05 NOTE — Patient Instructions (Signed)

## 2021-05-05 NOTE — Therapy (Signed)
[facility] [address]  [place], [place], [address] Phone: [phone]   Fax:  [phone]  Physical Therapy Treatment  Patient Details  Name: Kyle Eaton MRN: [id number] Date of Birth: 08-14-1975 Referring Provider (PT): Emelda Brothers MD   Encounter Date: 05/05/2021   PT End of Session - 05/05/21 1100     Visit Number 11    Number of Visits 14    Date for PT Re-Evaluation 05/05/21    PT Start Time 0848    PT Stop Time 0928    PT Time Calculation (min) 40 min    Activity Tolerance Patient tolerated treatment well    Behavior During Therapy [facility] for tasks assessed/performed             History reviewed. No pertinent past medical history.  Past Surgical History:  Procedure Laterality Date   BICEPS TENDON REPAIR      There were no vitals filed for this visit.   Subjective Assessment - 05/05/21 0851     Subjective Patient reports he missed visits due to 2 weeks of jury duty.  He has also been very less active over holidays, but is constantly in pain, thinks its muscle soreness.  Worst mornings and nights, better once up and moving, especially after doing his exercises.  Plans on joining a gym.    Limitations Sitting;Walking;Lifting;Standing;House hold activities    How long can you sit comfortably? couple hours with brace    How long can you stand comfortably? with brace couple hours, without brace 15 min    How long can you walk comfortably? with brace 20 minutes carefully, trouble going unp and down stairs    Diagnostic tests CT    Patient Stated Goals "full recovery, no long term disability"    Currently in Pain? Yes    Pain Score 2     Pain Location Back    Pain Orientation Mid                [facility] PT Assessment - 05/05/21 0001       Assessment   Medical Diagnosis closed fracture of thoracic vertebra    Referring Provider (PT) Emelda Brothers MD    Onset Date/Surgical Date  12/20/20    Hand Dominance Right                           [facility] Adult PT Treatment/Exercise - 05/05/21 0001       Self-Care   Self-Care Other Self-Care Comments    Other Self-Care Comments  review of thoracic stretches and self-mobs, can purchase foam roller at 5below      Lumbar Exercises: Aerobic   UBE (Upper Arm Bike) L3.0 2 min fwd/ 2 min back      Manual Therapy   Manual Therapy Joint mobilization;Soft tissue mobilization;Other (comment)    Manual therapy comments to midback to decrease muscle spasm and pain    Joint Mobilization UPA mobs thoracic spine    Soft tissue mobilization STM to thoracic paraspinals    Other Manual Therapy skilled palpation and monitoring with dry needling.              Trigger Point Dry Needling - 05/05/21 0001     Consent Given? Yes    Education Handout Provided Yes    Muscles Treated Back/Hip Erector spinae;Thoracic multifidi    Dry Needling Comments bil, T6-T10    Erector spinae  Response Twitch response elicited;Palpable increased muscle length    Thoracic multifidi response Twitch response elicited;Palpable increased muscle length                   PT Education - 05/05/21 1059     Education Details education on risks and benefits of dry needling.  Since needling over lung field, instructed in symptoms of pneumothorax (dry cough, chest pain, dyspnea with normal activities like walking up stairs) and what to do (go to ER and inform of location of dry needling).  After education patient consented to procedure.    Person(s) Educated Patient    Methods Explanation;Handout    Comprehension Verbalized understanding              PT Short Term Goals - 02/24/21 0938       PT SHORT TERM GOAL #1   Title Indepedent with Initial HEP    Time 2    Period Weeks    Status Achieved    Target Date 02/25/21               PT Long Term Goals - 05/05/21 1104       PT LONG TERM GOAL #1   Title Independent  with progressed HEP for postural strengthening.    Time 6    Period Weeks    Status Achieved   03/05/21- met for current  03/24/21- good compliance, continue to progress.     PT LONG TERM GOAL #2   Title Patient will be able to tolerate walking/standing/sitting for 2 hours without TLSO without increased back pain.    Baseline 15 minutes    Time 6    Period Weeks    Status Achieved      PT LONG TERM GOAL #3   Title Patient will report no more than 2/10 back pain with performing ADLs/bed mobility/stairs at home without TLSO.    Baseline 6    Period Weeks    Status Achieved   03/24/21- no pain, just soreness, occasional muscle spasms     PT LONG TERM GOAL #4   Title Patient will be able to lift 25lbs without increased back pain to pick up granddaughter.    Time 6    Period Weeks    Status Achieved   03/05/21- able to pick up carefully  11/18- able to consistently pick up granddaughter without trouble.     PT LONG TERM GOAL #5   Title Patient will be able to lift/carry 50lbs to return to work activities.    Baseline unable    Time 6    Period Weeks    Status Partially Met   03/31/21- able to lift 30lbs without pain  05/05/21- did not test today, but reports being able to lift more at work without pain.                  Plan - 05/05/21 1100     Clinical Impression Statement Patient reports that he is back to all activities without restriction, lifting more at work.  He reports he mostly feels like he has muscle spasms in his back, but overall pain improves significantly with movement.  Today after education on risks of dry needling, he did consent to trial of dry needling to midback.  Noted afterward decreased muscle spasm in bil thoracic erector spinae and multifidus, and patient reported decreased tightness and pain.  We briefly reveiwed stretches, exercises for upper back.  At this time he would  like to be put on 30 day hold, as he is planning on joining a gym and is independent  with his exercises.    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 Consider ways to work on strengthening for safe return for pt to lift/carry weight on a ladder.    PT Home Exercise Plan Access Code: [email or web address], (10/26 -update)    Consulted and Agree with Plan of Care Patient             Patient will benefit from skilled therapeutic intervention in order to improve the following deficits and impairments:  Decreased activity tolerance, Decreased endurance, Decreased range of motion, Increased fascial restricitons, Impaired UE functional use, Pain, Decreased mobility, Difficulty walking, Increased muscle spasms, Impaired flexibility, Postural dysfunction  Visit Diagnosis: Acute midline thoracic back pain  Muscle spasm of back  Muscle weakness (generalized)  Abnormal posture  Other abnormalities of gait and mobility     Problem List There are no problems to display for this patient.   Rennie Natter, PT, DPT  05/05/2021, 12:06 PM  [facility] [address]  [place], [place], [address] Phone: [phone]   Fax:  [phone]  Name: Kyle Eaton MRN: [id number] Date of Birth: 01-30-1976

## 2021-05-13 ENCOUNTER — Encounter: Payer: BC Managed Care – PPO | Admitting: Physical Therapy

## 2021-06-04 ENCOUNTER — Ambulatory Visit: Payer: BC Managed Care – PPO | Attending: Neurological Surgery | Admitting: Physical Therapy

## 2021-06-04 ENCOUNTER — Encounter: Payer: Self-pay | Admitting: Physical Therapy

## 2021-06-04 ENCOUNTER — Other Ambulatory Visit: Payer: Self-pay

## 2021-06-04 DIAGNOSIS — M62830 Muscle spasm of back: Secondary | ICD-10-CM | POA: Diagnosis present

## 2021-06-04 DIAGNOSIS — R2689 Other abnormalities of gait and mobility: Secondary | ICD-10-CM | POA: Insufficient documentation

## 2021-06-04 DIAGNOSIS — M546 Pain in thoracic spine: Secondary | ICD-10-CM

## 2021-06-04 DIAGNOSIS — R293 Abnormal posture: Secondary | ICD-10-CM | POA: Diagnosis present

## 2021-06-04 DIAGNOSIS — M6281 Muscle weakness (generalized): Secondary | ICD-10-CM | POA: Diagnosis present

## 2021-06-04 NOTE — Patient Instructions (Signed)
Access Code: [email or web address] URL: [email or web address] Date: 06/04/2021 Prepared by: Harrie Foreman  Exercises Quadruped Full Range Thoracic Rotation with Reach - 1 x daily - 7 x weekly - 3 sets - 10 reps

## 2021-06-04 NOTE — Therapy (Signed)
[facility] [address]  [place], [place], [address] Phone: [phone]   Fax:  [phone]  Physical Therapy Treatment Progress Note Reporting Period 05/05/21 to 06/04/21  See note below for Objective Data and Assessment of Progress/Goals.     Patient Details  Name: Kyle Eaton MRN: [id number] Date of Birth: 12/11/1975 Referring Provider (PT): Emelda Brothers MD   Encounter Date: 06/04/2021   PT End of Session - 06/04/21 0806     Visit Number 12    Number of Visits 14    Date for PT Re-Evaluation 07/02/21    PT Start Time 0803    PT Stop Time 0850    PT Time Calculation (min) 47 min    Activity Tolerance Patient tolerated treatment well    Behavior During Therapy [facility] for tasks assessed/performed             History reviewed. No pertinent past medical history.  Past Surgical History:  Procedure Laterality Date   BICEPS TENDON REPAIR      There were no vitals filed for this visit.   Subjective Assessment - 06/04/21 0805     Subjective Pt. reports he is back to work and going to the gym, but has a constant "drone" of pain.  Usually around a 3 most of the day, occasionally pops up to 4-5 when moves.  Has tried massages and using roller, with no improvement.  Taking Aleve daily.    Limitations Sitting;Walking;Lifting;Standing;House hold activities    How long can you sit comfortably? couple hours with brace    How long can you stand comfortably? with brace couple hours, without brace 15 min    How long can you walk comfortably? with brace 20 minutes carefully, trouble going unp and down stairs    Diagnostic tests CT    Patient Stated Goals "full recovery, no long term disability"    Currently in Pain? Yes    Pain Score 3     Pain Location Back    Pain Orientation Mid                [facility] PT Assessment - 06/04/21 0001       Assessment   Medical Diagnosis closed fracture of thoracic  vertebra    Referring Provider (PT) Emelda Brothers MD    Onset Date/Surgical Date 12/20/20    Hand Dominance Right      Precautions   Precautions None      Restrictions   Weight Bearing Restrictions No                           [facility] Adult PT Treatment/Exercise - 06/04/21 0001       Lumbar Exercises: Stretches   Other Lumbar Stretch Exercise thread the needle in quadruped x 10 bil for thoracic mobility      Lumbar Exercises: Aerobic   Nustep L5 x 6 min      Modalities   Modalities Ultrasound      Ultrasound   Ultrasound Location R erector spinae T8-T10    Ultrasound Parameters 1 MHz, 1.2 w/cm2, continuous    Ultrasound Goals Pain      Manual Therapy   Manual Therapy Joint mobilization;Soft tissue mobilization;Myofascial release    Manual therapy comments to midback to decrease muscle spasm and pain    Joint Mobilization R UPA and PA mobs T8-T10 Grade 3-4    Soft tissue mobilization STM  and IASTM with s/s tools to thoracic R paraspinals    Myofascial Release TPR to R erector spinae T8-T10                       PT Short Term Goals - 02/24/21 0938       PT SHORT TERM GOAL #1   Title Indepedent with Initial HEP    Time 2    Period Weeks    Status Achieved    Target Date 02/25/21               PT Long Term Goals - 06/04/21 0807       PT LONG TERM GOAL #1   Title Independent with progressed HEP for postural strengthening.    Time 6    Period Weeks    Status Achieved   03/05/21- met for current  03/24/21- good compliance, continue to progress.     PT LONG TERM GOAL #2   Title Patient will be able to tolerate walking/standing/sitting for 2 hours without TLSO without increased back pain.    Baseline 15 minutes    Time 6    Period Weeks    Status Achieved      PT LONG TERM GOAL #3   Title Patient will report no more than 2/10 back pain with performing ADLs/bed mobility/stairs at home without TLSO.    Baseline 6    Period  Weeks    Status Achieved   03/24/21- no pain, just soreness, occasional muscle spasms     PT LONG TERM GOAL #4   Title Patient will be able to lift 25lbs without increased back pain to pick up granddaughter.    Time 6    Period Weeks    Status Achieved   03/05/21- able to pick up carefully  11/18- able to consistently pick up granddaughter without trouble.     PT LONG TERM GOAL #5   Title Patient will be able to lift/carry 50lbs to return to work activities.    Baseline unable    Time 6    Period Weeks    Status Partially Met   03/31/21- able to lift 30lbs without pain  05/05/21- did not test today, but reports being able to lift more at work without pain.   Target Date 07/02/21      Additional Long Term Goals   Additional Long Term Goals Yes      PT LONG TERM GOAL #6   Title Pt. will report 0/10 pain at rest and no more than 2/10 back pain with activity.    Baseline constant 3/10 back pain.    Time 4    Period Weeks    Status New    Target Date 07/02/21                   Plan - 06/04/21 1771     Clinical Impression Statement Patient returns today after being placed on hold on 05/05/21 due to continued muscle spasm and back focused on R thoracic erector spinae T8-T10.  He reports being able to perform all work activities and exercise at gym, but despite performance of HEP and stretching and massage, continues to have muscle spasm.  He would benefit from continued skilled therapy 2x/week for 4 weeks, to focus on relieving this spasm and decrease pain, but also discussed making appt. with his neurosurgeon to discuss other options.  Since dry needling last session made him sore for about a week,  did not perform today but did focus manual therapy on spasm and mobilizing thoracic spine, followed by [place] to spasm.  Given new stretch to mobilize lower thoracic spine which he was able to perform.    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    PT Home Exercise Plan Access Code: [email or web address] (10/26 -update)    Consulted and Agree with Plan of Care Patient             Patient will benefit from skilled therapeutic intervention in order to improve the following deficits and impairments:  Decreased activity tolerance, Decreased endurance, Decreased range of motion, Increased fascial restricitons, Impaired UE functional use, Pain, Decreased mobility, Difficulty walking, Increased muscle spasms, Impaired flexibility, Postural dysfunction  Visit Diagnosis: Acute midline thoracic back pain  Muscle spasm of back  Muscle weakness (generalized)     Problem List There are no problems to display for this patient.   ERennie Natter PT, DPT  06/04/2021, 10:04 AM  [facility] [place] [address]Phone: [phone]  Fax:  [phone] Name: Kyle KloepferMRN: [id number] of Birth: 110/18/1977

## 2021-06-04 NOTE — Therapy (Deleted)
[facility] [address]  [place], [place], [address] Phone: [phone]   Fax:  [phone]  Physical Therapy Treatment Progress Note Reporting Period 05/05/2021 to 06/04/2021  See note below for Objective Data and Assessment of Progress/Goals.      Patient Details  Name: Kyle Eaton MRN: [id number] Date of Birth: [birth year] Referring Provider (PT): Emelda Brothers MD   Encounter Date: 06/04/2021   PT End of Session - 06/04/21 0806     Visit Number 12    Number of Visits 14    Date for PT Re-Evaluation 07/02/21    PT Start Time 0803    PT Stop Time 0850    PT Time Calculation (min) 47 min    Activity Tolerance Patient tolerated treatment well    Behavior During Therapy [facility] for tasks assessed/performed             History reviewed. No pertinent past medical history.  Past Surgical History:  Procedure Laterality Date   BICEPS TENDON REPAIR      There were no vitals filed for this visit.   Subjective Assessment - 06/04/21 0805     Subjective Pt. reports he is back to work and going to the gym, but has a constant "drone" of pain.  Usually around a 3 most of the day, occasionally pops up to 4-5 when moves.  Has tried massages and using roller, with no improvement.  Taking Aleve daily.    Limitations Sitting;Walking;Lifting;Standing;House hold activities    How long can you sit comfortably? couple hours with brace    How long can you stand comfortably? with brace couple hours, without brace 15 min    How long can you walk comfortably? with brace 20 minutes carefully, trouble going unp and down stairs    Diagnostic tests CT    Patient Stated Goals "full recovery, no long term disability"    Currently in Pain? Yes    Pain Score 3     Pain Location Back    Pain Orientation Mid                [facility] PT Assessment - 06/04/21 0001       Assessment   Medical Diagnosis closed fracture of thoracic  vertebra    Referring Provider (PT) Emelda Brothers MD    Onset Date/Surgical Date 12/20/20    Hand Dominance Right      Precautions   Precautions None      Restrictions   Weight Bearing Restrictions No                           [facility] Adult PT Treatment/Exercise - 06/04/21 0001       Lumbar Exercises: Stretches   Other Lumbar Stretch Exercise thread the needle in quadruped x 10 bil for thoracic mobility      Lumbar Exercises: Aerobic   Nustep L5 x 6 min      Modalities   Modalities Ultrasound      Ultrasound   Ultrasound Location R erector spinae T8-T10    Ultrasound Parameters 1 MHz, 1.2 w/cm2, continuous    Ultrasound Goals Pain      Manual Therapy   Manual Therapy Joint mobilization;Soft tissue mobilization;Myofascial release    Manual therapy comments to midback to decrease muscle spasm and pain    Joint Mobilization R UPA and PA mobs T8-T10 Grade 3-4    Soft tissue mobilization  STM and IASTM with s/s tools to thoracic R paraspinals    Myofascial Release TPR to R erector spinae T8-T10                       PT Short Term Goals - 02/24/21 0938       PT SHORT TERM GOAL #1   Title Indepedent with Initial HEP    Time 2    Period Weeks    Status Achieved    Target Date 02/25/21               PT Long Term Goals - 06/04/21 0807       PT LONG TERM GOAL #1   Title Independent with progressed HEP for postural strengthening.    Time 6    Period Weeks    Status Achieved   03/05/21- met for current  03/24/21- good compliance, continue to progress.     PT LONG TERM GOAL #2   Title Patient will be able to tolerate walking/standing/sitting for 2 hours without TLSO without increased back pain.    Baseline 15 minutes    Time 6    Period Weeks    Status Achieved      PT LONG TERM GOAL #3   Title Patient will report no more than 2/10 back pain with performing ADLs/bed mobility/stairs at home without TLSO.    Baseline 6    Period  Weeks    Status Achieved   03/24/21- no pain, just soreness, occasional muscle spasms     PT LONG TERM GOAL #4   Title Patient will be able to lift 25lbs without increased back pain to pick up granddaughter.    Time 6    Period Weeks    Status Achieved   03/05/21- able to pick up carefully  11/18- able to consistently pick up granddaughter without trouble.     PT LONG TERM GOAL #5   Title Patient will be able to lift/carry 50lbs to return to work activities.    Baseline unable    Time 6    Period Weeks    Status Partially Met   03/31/21- able to lift 30lbs without pain  05/05/21- did not test today, but reports being able to lift more at work without pain.   Target Date 07/02/21      Additional Long Term Goals   Additional Long Term Goals Yes      PT LONG TERM GOAL #6   Title Pt. will report 0/10 pain at rest and no more than 2/10 back pain with activity.    Baseline constant 3/10 back pain.    Time 4    Period Weeks    Status New    Target Date 07/02/21                   Plan - 06/04/21 4801     Clinical Impression Statement Patient returns today after being placed on hold on 05/05/21 due to continued muscle spasm and back focused on R thoracic erector spinae T8-T10.  He reports being able to perform all work activities and exercise at gym, but despite performance of HEP and stretching and massage, continues to have muscle spasm.  He would benefit from continued skilled therapy 2x/week for 4 weeks, to focus on relieving this spasm and decrease pain, but also discussed making appt. with his neurosurgeon to discuss other options.  Since dry needling last session made him sore for about a  week, did not perform today but did focus manual therapy on spasm and mobilizing thoracic spine, followed by [place] to spasm.  Given new stretch to mobilize lower thoracic spine which he was able to perform.    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 Consider ways to work on strengthening for safe return for pt to lift/carry weight on a ladder.    PT Home Exercise Plan Access Code: [email or web address], (10/26 -update)    Consulted and Agree with Plan of Care Patient             Patient will benefit from skilled therapeutic intervention in order to improve the following deficits and impairments:  Decreased activity tolerance, Decreased endurance, Decreased range of motion, Increased fascial restricitons, Impaired UE functional use, Pain, Decreased mobility, Difficulty walking, Increased muscle spasms, Impaired flexibility, Postural dysfunction  Visit Diagnosis: Acute midline thoracic back pain  Muscle spasm of back  Muscle weakness (generalized)     Problem List There are no problems to display for this patient.   Rennie Natter, PT 06/04/2021, 9:59 AM  [facility] [address]  [place], [place], [address] Phone: [phone]   Fax:  [phone]  Name: Ahmeer Tuman MRN: [id number] Date of Birth: 1976/04/04

## 2021-06-07 ENCOUNTER — Ambulatory Visit: Payer: BC Managed Care – PPO

## 2021-06-07 ENCOUNTER — Other Ambulatory Visit: Payer: Self-pay

## 2021-06-07 DIAGNOSIS — M546 Pain in thoracic spine: Secondary | ICD-10-CM

## 2021-06-07 DIAGNOSIS — M62830 Muscle spasm of back: Secondary | ICD-10-CM

## 2021-06-07 DIAGNOSIS — R2689 Other abnormalities of gait and mobility: Secondary | ICD-10-CM

## 2021-06-07 DIAGNOSIS — M6281 Muscle weakness (generalized): Secondary | ICD-10-CM

## 2021-06-07 DIAGNOSIS — R293 Abnormal posture: Secondary | ICD-10-CM

## 2021-06-07 NOTE — Therapy (Signed)
[facility] [address]  [place], [place], [address] Phone: [phone]   Fax:  [phone]  Physical Therapy Treatment  Patient Details  Name: Kyle Eaton MRN: [id number] Date of Birth: February 12, 1976 Referring Provider (PT): Emelda Brothers MD   Encounter Date: 06/07/2021   PT End of Session - 06/07/21 1507     Visit Number 13    Number of Visits 14    Date for PT Re-Evaluation 07/02/21    PT Start Time 1884    PT Stop Time 1530    PT Time Calculation (min) 44 min    Activity Tolerance Patient tolerated treatment well    Behavior During Therapy [facility] for tasks assessed/performed             History reviewed. No pertinent past medical history.  Past Surgical History:  Procedure Laterality Date   BICEPS TENDON REPAIR      There were no vitals filed for this visit.   Subjective Assessment - 06/07/21 1449     Subjective Pt reports that his back is doing the same as most days.    Diagnostic tests CT    Patient Stated Goals "full recovery, no long term disability"    Currently in Pain? Yes    Pain Score 3     Pain Location Back    Pain Orientation Mid    Pain Descriptors / Indicators Tightness                               [facility] Adult PT Treatment/Exercise - 06/07/21 0001       Lumbar Exercises: Stretches   Other Lumbar Stretch Exercise 3 way lumbar stretch 3x20" with green pball      Lumbar Exercises: Aerobic   Recumbent Bike L2x80min      Lumbar Exercises: Supine   Other Supine Lumbar Exercises thoracic extension over foam roll 10x      Lumbar Exercises: Sidelying   Other Sidelying Lumbar Exercises sidelying open books with [place] 10x each      Manual Therapy   Manual Therapy Joint mobilization;Soft tissue mobilization;Myofascial release;Other (comment)    Manual therapy comments to midback to decrease muscle spasm and pain    Joint Mobilization R UPA and PA mobs T8-T10  Grade 3-4    Soft tissue mobilization STM and IASTM with s/s tools to thoracic erector spinae    Myofascial Release TPR erector spinae    Other Manual Therapy skilled palpation and monitoring with dry needling.              Trigger Point Dry Needling - 06/07/21 0001     Consent Given? Yes    Education Handout Provided Previously provided    Muscles Treated Back/Hip Erector spinae    Dry Needling Comments R T6-7, L 8-9    Erector spinae Response Palpable increased muscle length                     PT Short Term Goals - 02/24/21 0938       PT SHORT TERM GOAL #1   Title Indepedent with Initial HEP    Time 2    Period Weeks    Status Achieved    Target Date 02/25/21               PT Long Term Goals - 06/04/21 1660       PT  LONG TERM GOAL #1   Title Independent with progressed HEP for postural strengthening.    Time 6    Period Weeks    Status Achieved   03/05/21- met for current  03/24/21- good compliance, continue to progress.     PT LONG TERM GOAL #2   Title Patient will be able to tolerate walking/standing/sitting for 2 hours without TLSO without increased back pain.    Baseline 15 minutes    Time 6    Period Weeks    Status Achieved      PT LONG TERM GOAL #3   Title Patient will report no more than 2/10 back pain with performing ADLs/bed mobility/stairs at home without TLSO.    Baseline 6    Period Weeks    Status Achieved   03/24/21- no pain, just soreness, occasional muscle spasms     PT LONG TERM GOAL #4   Title Patient will be able to lift 25lbs without increased back pain to pick up granddaughter.    Time 6    Period Weeks    Status Achieved   03/05/21- able to pick up carefully  11/18- able to consistently pick up granddaughter without trouble.     PT LONG TERM GOAL #5   Title Patient will be able to lift/carry 50lbs to return to work activities.    Baseline unable    Time 6    Period Weeks    Status Partially Met   03/31/21- able  to lift 30lbs without pain  05/05/21- did not test today, but reports being able to lift more at work without pain.   Target Date 07/02/21      Additional Long Term Goals   Additional Long Term Goals Yes      PT LONG TERM GOAL #6   Title Pt. will report 0/10 pain at rest and no more than 2/10 back pain with activity.    Baseline constant 3/10 back pain.    Time 4    Period Weeks    Status New    Target Date 07/02/21                   Plan - 06/07/21 1508     Clinical Impression Statement Pt reports still having spasms in his back along the thoracic spine. Started the session with mobility and stretch to decrease stiffness in the back. Some reports of "catching" along the R side of the lower thoracic spine with open books but no pain. After completed exercises with PTA today, this PT completed remainder of session for manual therapy and dry needling.   Noted today has muscle spasms both R erector spinae focused on T6, and L erector spinae around T8.  Also noted petichae formation in these areas as well after using IASTM tools.    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    PT Home Exercise Plan Access Code: [email or web address], (10/26 -update)    Consulted and Agree with Plan of Care Patient             Patient will benefit from skilled therapeutic intervention in order to  improve the following deficits and  impairments:  Decreased activity tolerance, Decreased endurance, Decreased range of motion, Increased fascial restricitons, Impaired UE functional use, Pain, Decreased mobility, Difficulty walking, Increased muscle spasms, Impaired flexibility, Postural dysfunction  Visit Diagnosis: Acute midline thoracic back pain  Muscle spasm of back  Muscle weakness (generalized)  Abnormal posture  Other abnormalities of gait and mobility     Problem List There are no problems to display for this patient.   Rennie Natter, PT, DPT  06/07/2021, 6:26 PM  [facility] [address]  [place], [place], [address] Phone: [phone]   Fax:  [phone]  Name: Kyle Eaton MRN: [id number] Date of Birth: 27-Feb-1976

## 2021-06-10 ENCOUNTER — Other Ambulatory Visit: Payer: Self-pay

## 2021-06-10 ENCOUNTER — Encounter: Payer: Self-pay | Admitting: Physical Therapy

## 2021-06-10 ENCOUNTER — Ambulatory Visit: Payer: BC Managed Care – PPO | Admitting: Physical Therapy

## 2021-06-10 DIAGNOSIS — M546 Pain in thoracic spine: Secondary | ICD-10-CM | POA: Diagnosis not present

## 2021-06-10 DIAGNOSIS — M62830 Muscle spasm of back: Secondary | ICD-10-CM

## 2021-06-10 DIAGNOSIS — M6281 Muscle weakness (generalized): Secondary | ICD-10-CM

## 2021-06-10 DIAGNOSIS — R293 Abnormal posture: Secondary | ICD-10-CM

## 2021-06-10 NOTE — Therapy (Signed)
[facility] [address]  [place], [place], [address] Phone: [phone]   Fax:  [phone]  Physical Therapy Treatment  Patient Details  Name: Kyle Eaton MRN: [id number] Date of Birth: [birth year] Referring Provider (PT): Emelda Brothers MD   Encounter Date: 06/10/2021   PT End of Session - 06/10/21 0803     Visit Number 14    Number of Visits 20    Date for PT Re-Evaluation 07/02/21    PT Start Time 0803    PT Stop Time 0845    PT Time Calculation (min) 42 min    Activity Tolerance Patient tolerated treatment well    Behavior During Therapy [facility] for tasks assessed/performed             History reviewed. No pertinent past medical history.  Past Surgical History:  Procedure Laterality Date   BICEPS TENDON REPAIR      There were no vitals filed for this visit.   Subjective Assessment - 06/10/21 0805     Subjective Pt. picked up the salon pas/lidocaine patches and it seems to be helping.    Diagnostic tests CT    Patient Stated Goals "full recovery, no long term disability"    Currently in Pain? Yes    Pain Score 2     Pain Location Back    Pain Orientation Lower                               [facility] Adult PT Treatment/Exercise - 06/10/21 0001       Lumbar Exercises: Aerobic   UBE (Upper Arm Bike) L3.5 x 6 min (73f/3b)      Lumbar Exercises: Prone   Other Prone Lumbar Exercises prone on elbows x 3 min    Other Prone Lumbar Exercises prone press-ups 2 x 10      Lumbar Exercises: Quadruped   [place] 10 reps    Single Arm Raise Right;Left;20 reps    Straight Leg Raise 20 reps    Other Quadruped Lumbar Exercises thread the needle from child pose x 10 bil      Manual Therapy   Manual Therapy Soft tissue mobilization;Myofascial release;Joint mobilization    Manual therapy comments to midback to decrease muscle spasm and pain    Joint Mobilization R UPA and PA  mobs T8-T10 Grade 3-4    Soft tissue mobilization STM and IASTM with s/s tools to thoracic erector spinae    Myofascial Release TPR erector spinae                       PT Short Term Goals - 02/24/21 0938       PT SHORT TERM GOAL #1   Title Indepedent with Initial HEP    Time 2    Period Weeks    Status Achieved    Target Date 02/25/21               PT Long Term Goals - 06/04/21 0807       PT LONG TERM GOAL #1   Title Independent with progressed HEP for postural strengthening.    Time 6    Period Weeks    Status Achieved   03/05/21- met for current  03/24/21- good compliance, continue to progress.     PT LONG TERM GOAL #2   Title Patient will be able to tolerate walking/standing/sitting  for 2 hours without TLSO without increased back pain.    Baseline 15 minutes    Time 6    Period Weeks    Status Achieved      PT LONG TERM GOAL #3   Title Patient will report no more than 2/10 back pain with performing ADLs/bed mobility/stairs at home without TLSO.    Baseline 6    Period Weeks    Status Achieved   03/24/21- no pain, just soreness, occasional muscle spasms     PT LONG TERM GOAL #4   Title Patient will be able to lift 25lbs without increased back pain to pick up granddaughter.    Time 6    Period Weeks    Status Achieved   03/05/21- able to pick up carefully  11/18- able to consistently pick up granddaughter without trouble.     PT LONG TERM GOAL #5   Title Patient will be able to lift/carry 50lbs to return to work activities.    Baseline unable    Time 6    Period Weeks    Status Partially Met   03/31/21- able to lift 30lbs without pain  05/05/21- did not test today, but reports being able to lift more at work without pain.   Target Date 07/02/21      Additional Long Term Goals   Additional Long Term Goals Yes      PT LONG TERM GOAL #6   Title Pt. will report 0/10 pain at rest and no more than 2/10 back pain with activity.    Baseline constant  3/10 back pain.    Time 4    Period Weeks    Status New    Target Date 07/02/21                   Plan - 06/10/21 0845     Clinical Impression Statement Progressed exercises today into extension, patient tolerated very well and reported decreased muscle tightness with exercises.  Noted petichae had resolved on back and no bruising.  Still has increased spasms bil thoracic erector spinae, but held dry needling today as still reports soreness following, and focused on gentle STM to decrease spasm.  He will start performing POE and repeated extensions at home.    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    PT Home Exercise Plan Access Code: [email or web address], (10/26 -update)    Consulted and Agree with Plan of Care Patient             Patient will benefit from skilled therapeutic intervention in order to improve the following deficits and impairments:  Decreased activity tolerance, Decreased endurance, Decreased range of motion, Increased fascial restricitons, Impaired UE functional use, Pain, Decreased mobility, Difficulty walking, Increased muscle spasms, Impaired flexibility, Postural dysfunction  Visit Diagnosis: Acute midline thoracic back pain  Muscle spasm of back  Muscle weakness (generalized)  Abnormal posture     Problem List There are no problems to display for this patient.   Rennie Natter, PT, DPT  06/10/2021, 8:50 AM  [facility] [address]  [place], [place], [address] Phone: [phone]   Fax:  [phone]  Name: Kathy Wares MRN: [id number] Date of Birth: 07-25-1975

## 2021-06-14 ENCOUNTER — Other Ambulatory Visit: Payer: Self-pay

## 2021-06-14 ENCOUNTER — Ambulatory Visit: Payer: BC Managed Care – PPO

## 2021-06-14 DIAGNOSIS — M546 Pain in thoracic spine: Secondary | ICD-10-CM

## 2021-06-14 DIAGNOSIS — M6281 Muscle weakness (generalized): Secondary | ICD-10-CM

## 2021-06-14 DIAGNOSIS — R2689 Other abnormalities of gait and mobility: Secondary | ICD-10-CM

## 2021-06-14 DIAGNOSIS — R293 Abnormal posture: Secondary | ICD-10-CM

## 2021-06-14 DIAGNOSIS — M62830 Muscle spasm of back: Secondary | ICD-10-CM

## 2021-06-14 NOTE — Therapy (Signed)
[facility] [address]  [place], [place], [address] Phone: [phone]   Fax:  [phone]  Physical Therapy Treatment  Patient Details  Name: Kyle Eaton MRN: [id number] Date of Birth: 16-Apr-1976 Referring Provider (PT): Emelda Brothers MD   Encounter Date: 06/14/2021   PT End of Session - 06/14/21 1445     Visit Number 15    Number of Visits 20    Date for PT Re-Evaluation 07/02/21    PT Start Time 2706    PT Stop Time 1458    PT Time Calculation (min) 53 min    Activity Tolerance Patient tolerated treatment well    Behavior During Therapy [facility] for tasks assessed/performed             History reviewed. No pertinent past medical history.  Past Surgical History:  Procedure Laterality Date   BICEPS TENDON REPAIR      There were no vitals filed for this visit.   Subjective Assessment - 06/14/21 1407     Subjective Pt reports that he hasn't seen much lasting improvement, everything gives temporary relief.    Diagnostic tests CT    Patient Stated Goals "full recovery, no long term disability"    Currently in Pain? Yes    Pain Score 2     Pain Location Back    Pain Orientation Lower    Pain Descriptors / Indicators Tightness    Pain Type Chronic pain                               [facility] Adult PT Treatment/Exercise - 06/14/21 0001       Lumbar Exercises: Stretches   Other Lumbar Stretch Exercise seated trunk rotation 10x      Lumbar Exercises: Aerobic   Recumbent Bike L5x43min      Lumbar Exercises: Supine   Other Supine Lumbar Exercises thoracic extension over foam roll 10x      Lumbar Exercises: Quadruped   Other Quadruped Lumbar Exercises thread the needle from child pose x 10 bil      Modalities   Modalities Electrical Stimulation      Electrical Stimulation   Electrical Stimulation Location thoracic paraspinals    Electrical Stimulation Action IFC     Electrical Stimulation Parameters 80-150 Hz, intensity to pt tolerance      Manual Therapy   Manual Therapy Soft tissue mobilization    Soft tissue mobilization STM and IASTM with foam roll to thoracic erector spinae                       PT Short Term Goals - 02/24/21 0938       PT SHORT TERM GOAL #1   Title Indepedent with Initial HEP    Time 2    Period Weeks    Status Achieved    Target Date 02/25/21               PT Long Term Goals - 06/04/21 0807       PT LONG TERM GOAL #1   Title Independent with progressed HEP for postural strengthening.    Time 6    Period Weeks    Status Achieved   03/05/21- met for current  03/24/21- good compliance, continue to progress.     PT LONG TERM GOAL #2   Title Patient will be able to tolerate walking/standing/sitting for  2 hours without TLSO without increased back pain.    Baseline 15 minutes    Time 6    Period Weeks    Status Achieved      PT LONG TERM GOAL #3   Title Patient will report no more than 2/10 back pain with performing ADLs/bed mobility/stairs at home without TLSO.    Baseline 6    Period Weeks    Status Achieved   03/24/21- no pain, just soreness, occasional muscle spasms     PT LONG TERM GOAL #4   Title Patient will be able to lift 25lbs without increased back pain to pick up granddaughter.    Time 6    Period Weeks    Status Achieved   03/05/21- able to pick up carefully  11/18- able to consistently pick up granddaughter without trouble.     PT LONG TERM GOAL #5   Title Patient will be able to lift/carry 50lbs to return to work activities.    Baseline unable    Time 6    Period Weeks    Status Partially Met   03/31/21- able to lift 30lbs without pain  05/05/21- did not test today, but reports being able to lift more at work without pain.   Target Date 07/02/21      Additional Long Term Goals   Additional Long Term Goals Yes      PT LONG TERM GOAL #6   Title Pt. will report 0/10 pain at  rest and no more than 2/10 back pain with activity.    Baseline constant 3/10 back pain.    Time 4    Period Weeks    Status New    Target Date 07/02/21                   Plan - 06/14/21 1446     Clinical Impression Statement Pt still reports that he experiences the same pain as before. He reported for the most part that he gets temporary relief from the exercises. We've been trying to get him as much relief as possible with STM, mobility exercises which they do help but the results don't last. Tried estim today and encouraged him to look into a TENS unit if this gives him more relief.    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    PT Home Exercise Plan Access Code: [email or web address], (10/26 -update)    Consulted and Agree with Plan of Care Patient             Patient will benefit from skilled therapeutic intervention in order to improve the following deficits and impairments:  Decreased activity tolerance, Decreased endurance, Decreased range of motion, Increased fascial restricitons, Impaired UE functional use, Pain, Decreased mobility, Difficulty walking, Increased muscle spasms, Impaired flexibility, Postural dysfunction  Visit Diagnosis: Acute midline thoracic back pain  Muscle spasm of back  Muscle weakness (generalized)  Abnormal posture  Other abnormalities of gait and mobility     Problem  List There are no problems to display for this patient.   Artist Pais, PTA 06/14/2021, 3:11 PM  [facility] [address]  [place], [place], [address] Phone: [phone]   Fax:  [phone]  Name: Kyle Eaton MRN: [id number] Date of Birth: May 11, 1975

## 2021-06-17 ENCOUNTER — Encounter: Payer: Self-pay | Admitting: Physical Therapy

## 2021-06-17 ENCOUNTER — Other Ambulatory Visit: Payer: Self-pay

## 2021-06-17 ENCOUNTER — Ambulatory Visit: Payer: BC Managed Care – PPO | Admitting: Physical Therapy

## 2021-06-17 DIAGNOSIS — M546 Pain in thoracic spine: Secondary | ICD-10-CM | POA: Diagnosis not present

## 2021-06-17 DIAGNOSIS — R2689 Other abnormalities of gait and mobility: Secondary | ICD-10-CM

## 2021-06-17 DIAGNOSIS — M62830 Muscle spasm of back: Secondary | ICD-10-CM

## 2021-06-17 DIAGNOSIS — R293 Abnormal posture: Secondary | ICD-10-CM

## 2021-06-17 DIAGNOSIS — M6281 Muscle weakness (generalized): Secondary | ICD-10-CM

## 2021-06-17 NOTE — Therapy (Signed)
[facility] [address]  [address] [place], [place], [address] Phone: [phone]   Fax:  [phone]  Physical Therapy Treatment  Patient Details  Name: Kyle Eaton MRN: [id number] Date of Birth: 11-15-1975 Referring Provider (PT): Autumn Patty MD   Encounter Date: 06/17/2021   PT End of Session - 06/17/21 0809     Visit Number 16    Number of Visits 20    Date for PT Re-Evaluation 07/02/21    Progress Note Due on Visit 20    PT Start Time 0805    PT Stop Time 0845    PT Time Calculation (min) 40 min    Activity Tolerance Patient tolerated treatment well    Behavior During Therapy [facility] for tasks assessed/performed             History reviewed. No pertinent past medical history.  Past Surgical History:  Procedure Laterality Date   BICEPS TENDON REPAIR      There were no vitals filed for this visit.   Subjective Assessment - 06/17/21 0808     Subjective Pt frustrated that his pain/tightness continues to come and go, pain is all day, and tasks that normally would take and hour take much longer now.  Reports was really tight last night but better this morning.  Pain 3/10.    Diagnostic tests CT    Patient Stated Goals "full recovery, no long term disability"    Currently in Pain? No/denies                               [facility] Adult PT Treatment/Exercise - 06/17/21 0001       Lumbar Exercises: Stretches   Other Lumbar Stretch Exercise seated trunk rotation 2 x 10 to side of preference      Lumbar Exercises: Aerobic   UBE (Upper Arm Bike) L2.5 x 6 min (71f/3b)      Lumbar Exercises: Quadruped   Other Quadruped Lumbar Exercises thread the needle from child pose x 10 bil      Shoulder Exercises: Therapy Ball   Right/Left 20 reps    Right/Left Limitations diagonals chops with 3000g (blue) ball 2 x 10 bil    Other Therapy Ball Exercises 5000g (orange ball) RDL with overhead lift 2 x  10      Shoulder Exercises: ROM/Strengthening   Lat Pull Limitations 25# 3x10   cues to engage lats prior to pull     Manual Therapy   Manual Therapy Myofascial release;Joint mobilization    Manual therapy comments to midback to decrease muscle spasm and pain    Joint Mobilization PA mobs T6-T10 grade 2-3    Myofascial Release MFR to erector spinae                     PT Education - 06/17/21 0910     Education Details Patient educated on the concept of the nervous system as the bodies alarm system, and the role of nociception to warn the body of danger.  Peripheral nerve sensitization, hyperalgesia and allodynia were explained using metaphors to promote deep learning.  Patient educated on the concept of neuroplasticity and how factors such as temperature, stress, movement, immunity and blood flow affect pain via channel expression.   Pt. to perform upper trunk twists throughout day focusing on side of preference to shorten tightened muscles.    Person(s) Educated Patient  Methods Explanation    Comprehension Verbalized understanding              PT Short Term Goals - 02/24/21 0938       PT SHORT TERM GOAL #1   Title Indepedent with Initial HEP    Time 2    Period Weeks    Status Achieved    Target Date 02/25/21               PT Long Term Goals - 06/17/21 0810       PT LONG TERM GOAL #1   Title Independent with progressed HEP for postural strengthening.    Time 6    Period Weeks    Status Achieved   03/05/21- met for current  03/24/21- good compliance, continue to progress.     PT LONG TERM GOAL #2   Title Patient will be able to tolerate walking/standing/sitting for 2 hours without TLSO without increased back pain.    Baseline 15 minutes    Time 6    Period Weeks    Status Achieved      PT LONG TERM GOAL #3   Title Patient will report no more than 2/10 back pain with performing ADLs/bed mobility/stairs at home without TLSO.    Baseline 6     Period Weeks    Status Achieved   03/24/21- no pain, just soreness, occasional muscle spasms     PT LONG TERM GOAL #4   Title Patient will be able to lift 25lbs without increased back pain to pick up granddaughter.    Time 6    Period Weeks    Status Achieved   03/05/21- able to pick up carefully  11/18- able to consistently pick up granddaughter without trouble.     PT LONG TERM GOAL #5   Title Patient will be able to lift/carry 50lbs to return to work activities.    Baseline unable    Time 6    Period Weeks    Status Partially Met   03/31/21- able to lift 30lbs without pain  05/05/21- did not test today, but reports being able to lift more at work without pain.   Target Date 07/02/21      PT LONG TERM GOAL #6   Title Pt. will report 0/10 pain at rest and no more than 2/10 back pain with activity.    Baseline constant 3/10 back pain.    Time 4    Period Weeks    Status On-going    Target Date 07/02/21                   Plan - 06/17/21 0901     Clinical Impression Statement Pt. reports that estim only provided temporary relief as well.  Today started PNE education and TMR based upper trunk twists to side of preference to help shorten tight mucles to release, as well as progressing functional movement patterns with weights and reviewing body mechanics with lifting. Focused manual therapy on gentle MFR to upper back to decrease spasm and improve blood flow, trying to avoid any soreness.  No pain reported end of session.    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    PT Home Exercise Plan Access Code: [email or web address], (10/26 -update)    Consulted and Agree with Plan of Care Patient             Patient will benefit from skilled therapeutic intervention in order to improve the following deficits and impairments:  Decreased activity tolerance, Decreased endurance, Decreased range of motion, Increased fascial restricitons, Impaired UE functional use, Pain, Decreased mobility, Difficulty walking, Increased muscle spasms, Impaired flexibility, Postural dysfunction  Visit Diagnosis: Acute midline thoracic back pain  Muscle spasm of back  Muscle weakness (generalized)  Abnormal posture  Other abnormalities of gait and mobility     Problem List There are no problems to display for this patient.   Rennie Natter, PT, DPT  06/17/2021, 9:17 AM  [facility] [address]  [place], [place], [address] Phone: [phone]   Fax:  [phone]  Name: Kyle Eaton MRN: [id number] Date of Birth: [birth year]

## 2021-06-23 ENCOUNTER — Ambulatory Visit: Payer: BC Managed Care – PPO

## 2021-06-23 ENCOUNTER — Other Ambulatory Visit: Payer: Self-pay

## 2021-06-23 DIAGNOSIS — M546 Pain in thoracic spine: Secondary | ICD-10-CM

## 2021-06-23 DIAGNOSIS — R293 Abnormal posture: Secondary | ICD-10-CM

## 2021-06-23 DIAGNOSIS — R2689 Other abnormalities of gait and mobility: Secondary | ICD-10-CM

## 2021-06-23 DIAGNOSIS — M62830 Muscle spasm of back: Secondary | ICD-10-CM

## 2021-06-23 DIAGNOSIS — M6281 Muscle weakness (generalized): Secondary | ICD-10-CM

## 2021-06-23 NOTE — Therapy (Signed)
[facility] [address]  [place], [place], [address] Phone: [phone]   Fax:  [phone]  Physical Therapy Treatment  Patient Details  Name: Kyle Eaton MRN: [id number] Date of Birth: 05-05-1975 Referring Provider (PT): Emelda Brothers MD   Encounter Date: 06/23/2021   PT End of Session - 06/23/21 1400     Visit Number 17    Number of Visits 20    Date for PT Re-Evaluation 07/02/21    PT Start Time 7169    PT Stop Time 1357    PT Time Calculation (min) 39 min    Activity Tolerance Patient tolerated treatment well    Behavior During Therapy [facility] for tasks assessed/performed             History reviewed. No pertinent past medical history.  Past Surgical History:  Procedure Laterality Date   BICEPS TENDON REPAIR      There were no vitals filed for this visit.   Subjective Assessment - 06/23/21 1321     Subjective Pt reports that his back is feeling better, avoids any movements that causes pain.    Diagnostic tests CT    Patient Stated Goals "full recovery, no long term disability"    Currently in Pain? No/denies                               [facility] Adult PT Treatment/Exercise - 06/23/21 0001       Lumbar Exercises: Aerobic   Recumbent Bike L2x74mn      Lumbar Exercises: Machines for Strengthening   Other Lumbar Machine Exercise pallof presses 10lb fwd/back, stir the pot; lat pulls 30lb 2x10    Other Lumbar Machine Exercise chest press machine 15lb 2x10, knee extension and flexion 25lb 2x10      Lumbar Exercises: Standing   Other Standing Lumbar Exercises OHP with blue weightball 2x10      Manual Therapy   Manual Therapy Soft tissue mobilization    Soft tissue mobilization IASTM with foam to throacic paraspinals                       PT Short Term Goals - 02/24/21 0938       PT SHORT TERM GOAL #1   Title Indepedent with Initial HEP    Time 2     Period Weeks    Status Achieved    Target Date 02/25/21               PT Long Term Goals - 06/17/21 0810       PT LONG TERM GOAL #1   Title Independent with progressed HEP for postural strengthening.    Time 6    Period Weeks    Status Achieved   03/05/21- met for current  03/24/21- good compliance, continue to progress.     PT LONG TERM GOAL #2   Title Patient will be able to tolerate walking/standing/sitting for 2 hours without TLSO without increased back pain.    Baseline 15 minutes    Time 6    Period Weeks    Status Achieved      PT LONG TERM GOAL #3   Title Patient will report no more than 2/10 back pain with performing ADLs/bed mobility/stairs at home without TLSO.    Baseline 6    Period Weeks    Status Achieved   03/24/21- no  pain, just soreness, occasional muscle spasms     PT LONG TERM GOAL #4   Title Patient will be able to lift 25lbs without increased back pain to pick up granddaughter.    Time 6    Period Weeks    Status Achieved   03/05/21- able to pick up carefully  11/18- able to consistently pick up granddaughter without trouble.     PT LONG TERM GOAL #5   Title Patient will be able to lift/carry 50lbs to return to work activities.    Baseline unable    Time 6    Period Weeks    Status Partially Met   03/31/21- able to lift 30lbs without pain  05/05/21- did not test today, but reports being able to lift more at work without pain.   Target Date 07/02/21      PT LONG TERM GOAL #6   Title Pt. will report 0/10 pain at rest and no more than 2/10 back pain with activity.    Baseline constant 3/10 back pain.    Time 4    Period Weeks    Status On-going    Target Date 07/02/21                   Plan - 06/23/21 1405     Clinical Impression Statement Pt came in feeling great today. Had no pain to start the session, able to progress gym based exercises w/o increased pain. Gave instruction on machines that are safe and best to keep moving w/o  increasing mid back pain. Pt reports as of now he has 1/10 pain at rest and still 3/10 with activity.  Pt responded well.    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    PT Home Exercise Plan Access Code: [email or web address] (10/26 -update)    Consulted and Agree with Plan of Care Patient             Patient will benefit from skilled therapeutic intervention in order to improve the following deficits and impairments:  Decreased activity tolerance, Decreased endurance, Decreased range of motion, Increased fascial restricitons, Impaired UE functional use, Pain, Decreased mobility, Difficulty walking, Increased muscle spasms, Impaired flexibility, Postural dysfunction  Visit Diagnosis: Acute midline thoracic back pain  Muscle spasm of back  Muscle weakness (generalized)  Abnormal posture  Other abnormalities of gait and mobility     Problem List There are no problems to display for this patient.   BArtist Pais PTA 06/23/2021, 2:54 PM  [facility] [place] [address]Phone: [phone]  Fax:  [phone] Name: Kyle BeaversMRN: [id number] of Birth: 1January 05, 1977

## 2021-06-25 ENCOUNTER — Other Ambulatory Visit: Payer: Self-pay

## 2021-06-25 ENCOUNTER — Ambulatory Visit: Payer: BC Managed Care – PPO | Admitting: Physical Therapy

## 2021-06-25 ENCOUNTER — Encounter: Payer: Self-pay | Admitting: Physical Therapy

## 2021-06-25 DIAGNOSIS — M546 Pain in thoracic spine: Secondary | ICD-10-CM | POA: Diagnosis not present

## 2021-06-25 DIAGNOSIS — M6281 Muscle weakness (generalized): Secondary | ICD-10-CM

## 2021-06-25 DIAGNOSIS — R293 Abnormal posture: Secondary | ICD-10-CM

## 2021-06-25 DIAGNOSIS — M62830 Muscle spasm of back: Secondary | ICD-10-CM

## 2021-06-25 NOTE — Therapy (Signed)
[facility] [address]  [place], [place], [address] Phone: [phone]   Fax:  [phone]  Physical Therapy Treatment  Patient Details  Name: Kyle Eaton MRN: [id number] Date of Birth: Jun 13, 1975 Referring Provider (PT): Emelda Brothers MD   Encounter Date: 06/25/2021   PT End of Session - 06/25/21 0809     Visit Number 18    Number of Visits 20    Date for PT Re-Evaluation 07/02/21    PT Start Time 0807    PT Stop Time 0845    PT Time Calculation (min) 38 min    Activity Tolerance Patient tolerated treatment well    Behavior During Therapy [facility] for tasks assessed/performed             History reviewed. No pertinent past medical history.  Past Surgical History:  Procedure Laterality Date   BICEPS TENDON REPAIR      There were no vitals filed for this visit.   Subjective Assessment - 06/25/21 0808     Subjective Pt reports doing a little better, thinks warmer weather + getting it worked out more helping.    Diagnostic tests CT    Patient Stated Goals "full recovery, no long term disability"    Currently in Pain? Yes    Pain Score 2     Pain Location Back                               [facility] Adult PT Treatment/Exercise - 06/25/21 0001       Lumbar Exercises: Stretches   Other Lumbar Stretch Exercise kneeling stretch for thoracic spine 5 x 20sec hold      Lumbar Exercises: Aerobic   Nustep L6 x 6 min      Lumbar Exercises: Machines for Strengthening   Other Lumbar Machine Exercise pallof presses 10lb stir the pot 2 x 10 bil      Lumbar Exercises: Standing   Functional Squats 20 reps    Functional Squats Limitations RDL with 10lb weight    Forward Lunge 20 reps    Forward Lunge Limitations with 10lb weight in hand for core, 2 x 10 each side switching weighted side    Other Standing Lumbar Exercises overhead presses with orange ball (5kg) starting from low stool 2 x  15    Other Standing Lumbar Exercises suitcase carries with 10lb weight f/b 2 x 61f bil each side, overhead carry 10# weigth 2 x 30' each side      Manual Therapy   Manual Therapy Soft tissue mobilization;Joint mobilization    Manual therapy comments to midback to decrease muscle spasm and pain    Joint Mobilization PA mobs grade 3-4 to T10-T12    Soft tissue mobilization IASTM with foam to lower thoracic paraspinals and QL                       PT Short Term Goals - 02/24/21 0938       PT SHORT TERM GOAL #1   Title Indepedent with Initial HEP    Time 2    Period Weeks    Status Achieved    Target Date 02/25/21               PT Long Term Goals - 06/17/21 0810       PT LONG TERM GOAL #1   Title Independent with  progressed HEP for postural strengthening.    Time 6    Period Weeks    Status Achieved   03/05/21- met for current  03/24/21- good compliance, continue to progress.     PT LONG TERM GOAL #2   Title Patient will be able to tolerate walking/standing/sitting for 2 hours without TLSO without increased back pain.    Baseline 15 minutes    Time 6    Period Weeks    Status Achieved      PT LONG TERM GOAL #3   Title Patient will report no more than 2/10 back pain with performing ADLs/bed mobility/stairs at home without TLSO.    Baseline 6    Period Weeks    Status Achieved   03/24/21- no pain, just soreness, occasional muscle spasms     PT LONG TERM GOAL #4   Title Patient will be able to lift 25lbs without increased back pain to pick up granddaughter.    Time 6    Period Weeks    Status Achieved   03/05/21- able to pick up carefully  11/18- able to consistently pick up granddaughter without trouble.     PT LONG TERM GOAL #5   Title Patient will be able to lift/carry 50lbs to return to work activities.    Baseline unable    Time 6    Period Weeks    Status Partially Met   03/31/21- able to lift 30lbs without pain  05/05/21- did not test today, but  reports being able to lift more at work without pain.   Target Date 07/02/21      PT LONG TERM GOAL #6   Title Pt. will report 0/10 pain at rest and no more than 2/10 back pain with activity.    Baseline constant 3/10 back pain.    Time 4    Period Weeks    Status On-going    Target Date 07/02/21                   Plan - 06/25/21 1116     Clinical Impression Statement Pt. reports some concern that pain seems to be moving down his back, reassured that this may be due to weakness in these areas from prolonged use of back brace and will continue to improve as strengthen.  Focused on anti-rotational exercises and progressive loading of spine, tolerated exercises well, and reported no increase in pain at end of session.    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    PT Home Exercise Plan Access Code: [email or web address] (10/26 -update)    Consulted and Agree with Plan of Care Patient             Patient will benefit from skilled therapeutic intervention in order to improve the following deficits and impairments:  Decreased activity tolerance, Decreased endurance, Decreased range of motion, Increased fascial restricitons, Impaired UE functional use, Pain, Decreased mobility, Difficulty walking, Increased muscle spasms, Impaired flexibility, Postural dysfunction  Visit Diagnosis: Acute midline thoracic back pain  Muscle  spasm of back  Muscle weakness (generalized)  Abnormal posture     Problem  List There are no problems to display for this patient.   Rennie Natter, PT, DPT  06/25/2021, 11:18 AM  [facility] [address]  [place], [place], [address] Phone: [phone]   Fax:  [phone]  Name: Romelle Reiley MRN: [id number] Date of Birth: [birth year]

## 2021-06-28 ENCOUNTER — Ambulatory Visit: Payer: BC Managed Care – PPO

## 2021-06-28 ENCOUNTER — Other Ambulatory Visit: Payer: Self-pay

## 2021-06-28 DIAGNOSIS — R293 Abnormal posture: Secondary | ICD-10-CM

## 2021-06-28 DIAGNOSIS — R2689 Other abnormalities of gait and mobility: Secondary | ICD-10-CM

## 2021-06-28 DIAGNOSIS — M546 Pain in thoracic spine: Secondary | ICD-10-CM | POA: Diagnosis not present

## 2021-06-28 DIAGNOSIS — M62830 Muscle spasm of back: Secondary | ICD-10-CM

## 2021-06-28 DIAGNOSIS — M6281 Muscle weakness (generalized): Secondary | ICD-10-CM

## 2021-06-28 NOTE — Therapy (Signed)
[facility] [address]  [place], [place], [address] Phone: [phone]   Fax:  [phone]  Physical Therapy Treatment  Patient Details  Name: Kyle Eaton MRN: [id number] Date of Birth: 1975/07/10 Referring Provider (PT): Emelda Brothers MD   Encounter Date: 06/28/2021   PT End of Session - 06/28/21 0828     Visit Number 19    Number of Visits 20    Date for PT Re-Evaluation 07/02/21    PT Start Time 0805   pt late   PT Stop Time 0843    PT Time Calculation (min) 38 min    Activity Tolerance Patient tolerated treatment well    Behavior During Therapy [facility] for tasks assessed/performed             History reviewed. No pertinent past medical history.  Past Surgical History:  Procedure Laterality Date   BICEPS TENDON REPAIR      There were no vitals filed for this visit.   Subjective Assessment - 06/28/21 0807     Subjective Pt feels that he is making improvements for the most part, he still is aware of certain spots that are tighter than others.    Diagnostic tests CT    Patient Stated Goals "full recovery, no long term disability"    Currently in Pain? Yes    Pain Score 2     Pain Location Back    Pain Orientation Right;Mid;Lower    Pain Descriptors / Indicators Tightness                               [facility] Adult PT Treatment/Exercise - 06/28/21 0001       Lumbar Exercises: Stretches   Active Hamstring Stretch Right;Left;30 seconds    Active Hamstring Stretch Limitations seated    Other Lumbar Stretch Exercise kneeling stretch for thoracic spine 3 x 20sec hold      Lumbar Exercises: Aerobic   UBE (Upper Arm Bike) L4.0 x 6 min (53f/3b)      Lumbar Exercises: Machines for Strengthening   Leg Press 55lb 2x10    Other Lumbar Machine Exercise lat pulls standing 20# 2x10; rows wide grip 35# 2x10      Lumbar Exercises: Standing   Other Standing Lumbar Exercises R/L trunk  rotation with D2 flexion green TB 2x10      Shoulder Exercises: Standing   Horizontal ABduction Strengthening;Both;20 reps;Theraband    Theraband Level (Shoulder Horizontal ABduction) Level 3 (Green)      Manual Therapy   Manual Therapy Soft tissue mobilization    Soft tissue mobilization IASTM with foam to  thoracolumbar paraspinals                       PT Short Term Goals - 02/24/21 0938       PT SHORT TERM GOAL #1   Title Indepedent with Initial HEP    Time 2    Period Weeks    Status Achieved    Target Date 02/25/21               PT Long Term Goals - 06/17/21 0810       PT LONG TERM GOAL #1   Title Independent with progressed HEP for postural strengthening.    Time 6    Period Weeks    Status Achieved   03/05/21- met for current  03/24/21- good  compliance, continue to progress.     PT LONG TERM GOAL #2   Title Patient will be able to tolerate walking/standing/sitting for 2 hours without TLSO without increased back pain.    Baseline 15 minutes    Time 6    Period Weeks    Status Achieved      PT LONG TERM GOAL #3   Title Patient will report no more than 2/10 back pain with performing ADLs/bed mobility/stairs at home without TLSO.    Baseline 6    Period Weeks    Status Achieved   03/24/21- no pain, just soreness, occasional muscle spasms     PT LONG TERM GOAL #4   Title Patient will be able to lift 25lbs without increased back pain to pick up granddaughter.    Time 6    Period Weeks    Status Achieved   03/05/21- able to pick up carefully  11/18- able to consistently pick up granddaughter without trouble.     PT LONG TERM GOAL #5   Title Patient will be able to lift/carry 50lbs to return to work activities.    Baseline unable    Time 6    Period Weeks    Status Partially Met   03/31/21- able to lift 30lbs without pain  05/05/21- did not test today, but reports being able to lift more at work without pain.   Target Date 07/02/21      PT  LONG TERM GOAL #6   Title Pt. will report 0/10 pain at rest and no more than 2/10 back pain with activity.    Baseline constant 3/10 back pain.    Time 4    Period Weeks    Status On-going    Target Date 07/02/21                   Plan - 06/28/21 0844     Clinical Impression Statement Pt now reports that the R thoracic portion of his back has improved in symptoms but the lower back has been a little more bothersome. He continues to do well with the progression of exercises. Worked more on postural exercises and thoracic mobility to reduce strain on the lumbar spine, then finished with STM to the thoracolumbar musculature of the spine to decrease stiffness and spasm. Pt did report post session that he plans of wrapping up PT, next visit.    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    PT Home Exercise Plan Access Code: [email or web address], (10/26 -update)    Consulted and Agree with Plan of Care Patient             Patient will benefit from skilled therapeutic intervention in order to improve the following deficits and impairments:  Decreased activity tolerance, Decreased endurance, Decreased range of motion, Increased fascial restricitons, Impaired UE functional use, Pain, Decreased mobility, Difficulty walking, Increased muscle spasms, Impaired flexibility, Postural dysfunction  Visit Diagnosis: Acute midline  thoracic back pain  Muscle spasm of back  Muscle weakness (generalized)  Abnormal posture  Other abnormalities of gait and mobility     Problem List There  are no problems to display for this patient.   Artist Pais, PTA 06/28/2021, 8:56 AM  [facility] [address]  [place], [place], [address] Phone: [phone]   Fax:  [phone]  Name: Kyle Eaton MRN: [id number] Date of Birth: 1975-05-16

## 2021-07-01 ENCOUNTER — Other Ambulatory Visit: Payer: Self-pay

## 2021-07-01 ENCOUNTER — Encounter: Payer: Self-pay | Admitting: Physical Therapy

## 2021-07-01 ENCOUNTER — Ambulatory Visit: Payer: BC Managed Care – PPO | Attending: Neurological Surgery | Admitting: Physical Therapy

## 2021-07-01 DIAGNOSIS — R293 Abnormal posture: Secondary | ICD-10-CM | POA: Diagnosis present

## 2021-07-01 DIAGNOSIS — M62830 Muscle spasm of back: Secondary | ICD-10-CM | POA: Diagnosis present

## 2021-07-01 DIAGNOSIS — M546 Pain in thoracic spine: Secondary | ICD-10-CM

## 2021-07-01 DIAGNOSIS — M6281 Muscle weakness (generalized): Secondary | ICD-10-CM

## 2021-07-01 NOTE — Patient Instructions (Signed)
Access Code: [email or web address] ?URL: [email or web address] ?Date: 07/01/2021 ?Prepared by: Harrie Foreman ? ?Exercises ?Bird Dog - 1 x daily - 7 x weekly - 3 sets - 10 reps ?Child's Pose with Sidebending - 1 x daily - 7 x weekly - 1 sets - 3 reps - 15 sec hold ?Wall Angels - 1 x daily - 7 x weekly - 2 sets - 10 reps ?Cat Cow - 1 x daily - 7 x weekly - 2 sets - 10 reps ?Sidelying Open Book - 1 x daily - 7 x weekly - 2 sets - 10 reps ?[place] Deadlift - 1 x daily - 7 x weekly - 3 sets - 10 reps ?Single-Leg [place] Deadlift With Kettlebell - 1 x daily - 7 x weekly - 3 sets - 10 reps ?Standing Lean Away Doorway Stretch - 1 x daily - 7 x weekly - 3 sets - 10 reps ?Standing Quadratus Lumborum Stretch with Doorway - 1 x daily - 7 x weekly - 3 sets - 10 reps ?Supine Straight Leg Lumbar Rotation Stretch - 1 x daily - 7 x weekly - 3 sets - 10 reps ?Posterior Chain Stretch - 1 x daily - 7 x weekly - 3 sets - 10 reps ? ?

## 2021-07-01 NOTE — Therapy (Signed)
[facility] ?[place] ?[place], [place], [address] ?Phone: [phone]   Fax:  [phone] ? ?Physical Therapy Treatment ?PHYSICAL THERAPY DISCHARGE SUMMARY ? ?Visits from Start of Care: 20 ? ?Current functional level related to goals / functional outcomes: ?Decreased thoracic pain, improved functional abilities including lifting and work activities ?  ?Remaining deficits: ?Tissue sensitization and fear avoidance behaviors.    ?  ?Education / Equipment: ?Education on pain neuroscience, HEP  ?Plan: ?Patient agrees to discharge.   Patient is being discharged due to meeting the stated rehab goals.    ? ? ? ?Patient Details  ?Name: Kyle Eaton ?MRN: [id number] ?Date of Birth: 02/01/1976 ?Referring Provider (PT): Emelda Brothers MD ? ? ?Encounter Date: 07/01/2021 ? ? PT End of Session - 07/01/21 0804   ? ? Visit Number 20   ? Number of Visits 20   ? Date for PT Re-Evaluation 07/02/21   ? PT Start Time 0802   ? PT Stop Time 0850   ? PT Time Calculation (min) 48 min   ? Activity Tolerance Patient tolerated treatment well   ? Behavior During Therapy [facility] for tasks assessed/performed   ? ?  ?  ? ?  ? ? ?History reviewed. No pertinent past medical history. ? ?Past Surgical History:  ?Procedure Laterality Date  ? BICEPS TENDON REPAIR    ? ? ?There were no vitals filed for this visit. ? ? Subjective Assessment - 07/01/21 0803   ? ? Subjective Pt. reports he took Aleve yesterday, made him feel good rest of day, today just a little stiffness.   ? Diagnostic tests CT   ? Patient Stated Goals "full recovery, no long term disability"   ? Pain Score 0-No pain   ? ?  ?  ? ?  ? ? ? ? ? [facility] PT Assessment - 07/01/21 0001   ? ?  ? Assessment  ? Medical Diagnosis closed fracture of thoracic vertebra   ? Referring Provider (PT) Emelda Brothers MD   ? Onset Date/Surgical Date 12/20/20   ? Hand Dominance Right   ? ?  ?  ? ?  ? ? ? ? ? ? ? ? ? ? ? ? ? ? ? ? [facility] Adult PT  Treatment/Exercise - 07/01/21 0001   ? ?  ? Self-Care  ? Lifting cues to focus on form and hinging at hips   ? Other Self-Care Comments  review of use of massage gun, also pain neuroscience and tissue sensitization, discussion on progression of exercises and stretches, incorporate yoga back into routine as well   ?  ? Lumbar Exercises: Stretches  ? Lower Trunk Rotation 3 reps   ? Lower Trunk Rotation Limitations seated lumbar rotation for posterior chain x 3 each side, also supine LTR stretch with leg extended 3 x 15 sec holds.   ? Standing Side Bend Right;Left;3 reps   ? Standing Side Bend Limitations standing QL stretch in dorway.   ? Other Lumbar Stretch Exercise thoracic stretch in doorway 3 x 15 sec hold, trialed pretzel stretch but lacking hip mobility- continue open books.   ? Other Lumbar Stretch Exercise standing lunges for hip strengthening x 5 bil   ?  ? Lumbar Exercises: Aerobic  ? UBE (Upper Arm Bike) L4.0 x 6 min (85f/3b)   ?  ? Lumbar Exercises: Standing  ? Other Standing Lumbar Exercises RDLs - x 10 unweighted, x 15 with 5kg ball with overhead  press, x 10 single leg RDL bil   ? Other Standing Lumbar Exercises standing squats with foam roller against wall x 10   ? ?  ?  ? ?  ? ? ? ? ? ? ? ? ? ? PT Education - 07/01/21 0906   ? ? Education Details HEP update, continued education on pain neuroscience, review of activities and how to progress selfl.   ? Person(s) Educated Patient   ? Methods Explanation;Demonstration;Handout;Verbal cues   ? Comprehension Verbalized understanding;Returned demonstration   ? ?  ?  ? ?  ? ? ? PT Short Term Goals - 02/24/21 0938   ? ?  ? PT SHORT TERM GOAL #1  ? Title Indepedent with Initial HEP   ? Time 2   ? Period Weeks   ? Status Achieved   ? Target Date 02/25/21   ? ?  ?  ? ?  ? ? ? ? PT Long Term Goals - 07/01/21 0843   ? ?  ? PT LONG TERM GOAL #1  ? Title Independent with progressed HEP for postural strengthening.   ? Time 6   ? Period Weeks   ? Status Achieved    03/05/21- met for current  03/24/21- good compliance, continue to progress.  ?  ? PT LONG TERM GOAL #2  ? Title Patient will be able to tolerate walking/standing/sitting for 2 hours without TLSO without increased back pain.   ? Baseline 15 minutes   ? Time 6   ? Period Weeks   ? Status Achieved   ?  ? PT LONG TERM GOAL #3  ? Title Patient will report no more than 2/10 back pain with performing ADLs/bed mobility/stairs at home without TLSO.   ? Baseline 6   ? Period Weeks   ? Status Achieved   03/24/21- no pain, just soreness, occasional muscle spasms  ?  ? PT LONG TERM GOAL #4  ? Title Patient will be able to lift 25lbs without increased back pain to pick up granddaughter.   ? Time 6   ? Period Weeks   ? Status Achieved   03/05/21- able to pick up carefully  11/18- able to consistently pick up granddaughter without trouble.  ?  ? PT LONG TERM GOAL #5  ? Title Patient will be able to lift/carry 50lbs to return to work activities.   ? Baseline unable   ? Time 6   ? Period Weeks   ? Status Partially Met   03/31/21- able to lift 30lbs without pain  05/05/21- did not test today, but reports being able to lift more at work without pain. 07/01/21- can participate in gym exercise without pain.  ? Target Date 07/02/21   ?  ? PT LONG TERM GOAL #6  ? Title Pt. will report 0/10 pain at rest and no more than 2/10 back pain with activity.   ? Baseline constant 3/10 back pain.   ? Time 4   ? Period Weeks   ? Status Achieved   07/01/21- was able to perform all activities today without pain.  Reports taking Aleve occasionally.  ? Target Date 07/02/21   ? ?  ?  ? ?  ? ? ? ? ? ? ? ? Plan - 07/01/21 0855   ? ? Clinical Impression Statement Pt. reports significant improvement in thoracic and lumbar pain/tightness, although still reports some avoidance of activities due to fear.  Focused today on progressing stretches and exercises for strengthening, also discussing  pain neuroscience and reassured back has healed and muscles are strong, but  tissues are still sensitized and over time should continue to improve.  He is ready and willing for discharge today.   ? Personal Factors and Comorbidities Comorbidity 1   ? 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 PT Frequency 2x / week   ? PT Duration 4 weeks   ? PT Treatment/Interventions ADLs/Self Care Home Management;Cryotherapy;Electrical Stimulation;Moist Heat;Functional mobility training;Therapeutic activities;Therapeutic exercise;Stair training;Neuromuscular re-education;Patient/family education;Manual techniques;Passive range of motion;Dry needling;Taping;Spinal Manipulations;Joint Manipulations;Aquatic Therapy;Iontophoresis 4mg /ml Dexamethasone   ? PT Next Visit Plan D/C;focus on decreasing spasm/pain, continue strengthening.   ? PT Home Exercise Plan Access Code: [email or web address], (10/26 -update)   ? Consulted and Agree with Plan of Care Patient   ? ?  ?  ? ?  ? ? ?Patient will benefit from skilled therapeutic intervention in order to improve the following deficits and impairments:  Decreased activity tolerance, Decreased endurance, Decreased range of motion, Increased fascial restricitons, Impaired UE functional use, Pain, Decreased mobility, Difficulty walking, Increased muscle spasms, Impaired flexibility, Postural dysfunction ? ?Visit Diagnosis: ?Acute midline thoracic back pain ? ?Muscle spasm of back ? ?Muscle weakness (generalized) ? ?Abnormal posture ? ? ? ? ?Problem List ?There are no problems to display for this patient. ? ? ?Rennie Natter, PT, DPT ?07/01/2021, 9:06 AM ? ?[facility] ?[facility] ?[place] ?[place], [place], [address] ?Phone: [phone]   Fax:  [phone] ? ?Name: Kyle Eaton ?MRN: [id number] ?Date of Birth: 07-25-1975 ? ? ? ?

## 2021-07-19 ENCOUNTER — Other Ambulatory Visit: Payer: Self-pay | Admitting: Neurological Surgery

## 2021-08-04 NOTE — Progress Notes (Signed)
Surgical Instructions ? ? ? Your procedure is scheduled on 08/12/21. ? Report to [facility] Main Entrance "A" at 5:30 A.M., then check in with the Admitting office. ? Call this number if you have problems the morning of surgery: ? [phone] ? ? If you have any questions prior to your surgery date call [phone]: Open Monday-Friday 8am-4pm ? ? ? Remember: ? Do not eat after midnight the night before your surgery ? ?You may drink clear liquids until 4:30am the morning of your surgery.   ?Clear liquids allowed are: Water, Non-Citrus Juices (without pulp), Carbonated Beverages, Clear Tea, Black Coffee ONLY (NO MILK, CREAM OR POWDERED CREAMER of any kind), and Gatorade ?  ? Take these medicines the morning of surgery with A SIP OF WATER: NONE ? ? ?As of today, STOP taking any Aspirin (unless otherwise instructed by your surgeon) Aleve, Naproxen, Ibuprofen, Motrin, Advil, Goody's, BC's, all herbal medications, fish oil, and all vitamins. ? ?         ?Do not wear jewelry or makeup ?Do not wear lotions, powders, perfumes/colognes, or deodorant. ?Men may shave face and neck. ?Do not bring valuables to the hospital. ?Do not wear nail polish, gel polish, artificial nails, or any other type of covering on natural nails (fingers and toes) ?If you have artificial nails or gel coating that need to be removed by a nail salon, please have this removed prior to surgery. Artificial nails or gel coating may interfere with anesthesia's ability to adequately monitor your vital signs. ? ?[facility] is not responsible for any belongings or valuables. .  ? ?Do NOT Smoke (Tobacco/Vaping)  24 hours prior to your procedure ? ?If you use a CPAP at night, you may bring your mask for your overnight stay. ?  ?Contacts, glasses, hearing aids, dentures or partials may not be worn into surgery, please bring cases for these belongings ?  ?For patients admitted to the hospital, discharge time will be determined by your treatment team. ?   ?Patients discharged the day of surgery will not be allowed to drive home, and someone needs to stay with them for 24 hours. ? ? ?SURGICAL WAITING ROOM VISITATION ?Patients having surgery or a procedure in a hospital may have two support people. ?Children under the age of 2 must have an adult with them who is not the patient. ?They may stay in the waiting area during the procedure and may switch out with other visitors. If the patient needs to stay at the hospital during part of their recovery, the visitor guidelines for inpatient rooms apply. ? ?Please refer to the [facility] website for the visitor guidelines for Inpatients (after your surgery is over and you are in a regular room).  ? ? ? ? ? ?Special instructions:   ? ?Oral Hygiene is also important to reduce your risk of infection.  Remember - BRUSH YOUR TEETH THE MORNING OF SURGERY WITH YOUR REGULAR TOOTHPASTE ? ? ?[facility] [place]- Preparing For Surgery ? ?Before surgery, you can play an important role. Because skin is not sterile, your skin needs to be as free of germs as possible. You can reduce the number of germs on your skin by washing with CHG (chlorahexidine gluconate) Soap before surgery.  CHG is an antiseptic cleaner which kills germs and bonds with the skin to continue killing germs even after washing.   ? ? ?Please do not use if you have an allergy to CHG or antibacterial soaps. If your skin becomes reddened/irritated stop using  the CHG.  ?Do not shave (including legs and underarms) for at least 48 hours prior to first CHG shower. It is OK to shave your face. ? ?Please follow these instructions carefully. ?  ? ? Shower the NIGHT BEFORE SURGERY and the MORNING OF SURGERY with CHG Soap.  ? If you chose to wash your hair, wash your hair first as usual with your normal shampoo. After you shampoo, rinse your hair and body thoroughly to remove the shampoo.  Then [employer] and genitals (private parts) with your normal soap and rinse thoroughly to remove  soap. ? ?After that Use CHG Soap as you would any other liquid soap. You can apply CHG directly to the skin and wash gently with a scrungie or a clean washcloth.  ? ?Apply the CHG Soap to your body ONLY FROM THE NECK DOWN.  Do not use on open wounds or open sores. Avoid contact with your eyes, ears, mouth and genitals (private parts). Wash Face and genitals (private parts)  with your normal soap.  ? ?Wash thoroughly, paying special attention to the area where your surgery will be performed. ? ?Thoroughly rinse your body with warm water from the neck down. ? ?DO NOT shower/wash with your normal soap after using and rinsing off the CHG Soap. ? ?Pat yourself dry with a CLEAN TOWEL. ? ?Wear CLEAN PAJAMAS to bed the night before surgery ? ?Place CLEAN SHEETS on your bed the night before your surgery ? ?DO NOT SLEEP WITH PETS. ? ? ?Day of Surgery: ?Take a shower with CHG soap. ?Wear Clean/Comfortable clothing the morning of surgery ?Do not apply any deodorants/lotions.   ?Remember to brush your teeth WITH YOUR REGULAR TOOTHPASTE. ? ? ? ?If you received a COVID test during your pre-op visit  it is requested that you wear a mask when out in public, stay away from anyone that may not be feeling well and notify your surgeon if you develop symptoms. If you have been in contact with anyone that has tested positive in the last 10 days please notify you surgeon. ? ?  ?Please read over the following fact sheets that you were given.  ? ?

## 2021-08-05 ENCOUNTER — Other Ambulatory Visit: Payer: Self-pay

## 2021-08-05 ENCOUNTER — Encounter (HOSPITAL_COMMUNITY): Payer: Self-pay

## 2021-08-05 ENCOUNTER — Encounter (HOSPITAL_COMMUNITY)
Admission: RE | Admit: 2021-08-05 | Discharge: 2021-08-05 | Disposition: A | Discharge status: Home / Self Care | Payer: BC Managed Care – PPO | Source: Ambulatory Visit | Attending: Neurological Surgery | Admitting: Neurological Surgery

## 2021-08-05 VITALS — BP 138/93 | HR 56 | Temp 98.3°F | Resp 18 | Ht 70.0 in | Wt 271.7 lb

## 2021-08-05 DIAGNOSIS — Z01818 Encounter for other preprocedural examination: Secondary | ICD-10-CM | POA: Diagnosis present

## 2021-08-05 HISTORY — DX: Personal history of urinary calculi: Z87.442

## 2021-08-05 HISTORY — DX: Cardiac murmur, unspecified: R01.1

## 2021-08-05 LAB — BASIC METABOLIC PANEL
Anion gap: 7 (ref 5–15)
BUN: 12 mg/dL (ref 6–20)
CO2: 24 mmol/L (ref 22–32)
Calcium: 9.2 mg/dL (ref 8.9–10.3)
Chloride: 107 mmol/L (ref 98–111)
Creatinine, Ser: 1.02 mg/dL (ref 0.61–1.24)
GFR, Estimated: >60 mL/min (ref >60)
Glucose, Bld: 105 mg/dL — ABNORMAL HIGH (ref 70–99)
Potassium: 4.1 mmol/L (ref 3.5–5.1)
Sodium: 138 mmol/L (ref 135–145)

## 2021-08-05 LAB — TYPE AND SCREEN
ABO/RH(D): O POS
Antibody Screen: NEGATIVE

## 2021-08-05 LAB — SURGICAL PCR SCREEN
MRSA, PCR: NEGATIVE
Staphylococcus aureus: NEGATIVE

## 2021-08-05 LAB — CBC
HCT: 40.9 % (ref 39.0–52.0)
Hemoglobin: 13.4 g/dL (ref 13.0–17.0)
MCH: 27.9 pg (ref 26.0–34.0)
MCHC: 32.8 g/dL (ref 30.0–36.0)
MCV: 85.2 fL (ref 80.0–100.0)
Platelets: 341 10*3/uL (ref 150–400)
RBC: 4.8 MIL/uL (ref 4.22–5.81)
RDW: 13.9 % (ref 11.5–15.5)
WBC: 6.8 10*3/uL (ref 4.0–10.5)
nRBC: 0 % (ref 0.0–0.2)

## 2021-08-05 NOTE — Progress Notes (Signed)
PCP - Harrell Lark Podraza ?Cardiologist - none ? ?PPM/ICD - denies ?Device Orders -  ?Rep Notified -  ? ?Chest x-ray - none ?EKG - 08/05/21 ?Stress Test - none ?ECHO - none ?Cardiac Cath - none ? ?Sleep Study - none ?CPAP -  ? ?Fasting Blood Sugar - n/a ?Checks Blood Sugar _____ times a day ? ?Blood Thinner Instructions:n/a ?Aspirin Instructions:n/a ? ?ERAS Protcol -clear liquids until 0430 ?PRE-SURGERY Ensure or G2- none ? ?COVID TEST- n/a ? ? ?Anesthesia review: no ? ?Patient denies shortness of breath, fever, cough and chest pain at PAT appointment ? ? ?All instructions explained to the patient, with a verbal understanding of the material. Patient agrees to go over the instructions while at home for a better understanding. Patient also instructed to wear a mask when out in public prior to surgery. . The opportunity to ask questions was provided. ?  ?

## 2021-08-11 NOTE — Anesthesia Preprocedure Evaluation (Addendum)
Anesthesia Evaluation  ?Patient identified by MRN, date of birth, ID band ?Patient awake ? ? ? ?Reviewed: ?Allergy & Precautions, NPO status , Patient's Chart, lab work & pertinent test results ? ?Airway ?Mallampati: I ? ?TM Distance: >3 FB ?Neck ROM: Full ? ? ? Dental ?no notable dental hx. ?(+) Dental Advisory Given, Teeth Intact ?  ?Pulmonary ?neg pulmonary ROS,  ?  ?Pulmonary exam normal ?breath sounds clear to auscultation ? ? ? ? ? ? Cardiovascular ?Normal cardiovascular exam+ Valvular Problems/Murmurs  ?Rhythm:Regular Rate:Normal ? ? ?  ?Neuro/Psych ?negative neurological ROS ?   ? GI/Hepatic ?negative GI ROS, Neg liver ROS,   ?Endo/Other  ?negative endocrine ROS ? Renal/GU ?negative Renal ROS  ? ?  ?Musculoskeletal ?negative musculoskeletal ROS ?(+)  ? Abdominal ?(+) + obese,   ?Peds ? Hematology ?negative hematology ROS ?(+)   ?Anesthesia Other Findings ? ? Reproductive/Obstetrics ? ?  ? ? ? ? ? ? ? ? ? ? ? ? ? ?  ?  ? ? ? ? ? ? ? ?Anesthesia Physical ?Anesthesia Plan ? ?ASA: 2 ? ?Anesthesia Plan: General  ? ?Post-op Pain Management: Ketamine IV*, Tylenol PO (pre-op)* and Gabapentin PO (pre-op)*  ? ?Induction: Intravenous ? ?PONV Risk Score and Plan: 3 and Ondansetron, Dexamethasone, Treatment may vary due to age or medical condition and Midazolam ? ?Airway Management Planned: Oral ETT ? ?Additional Equipment: None ? ?Intra-op Plan:  ? ?Post-operative Plan: Extubation in OR ? ?Informed Consent: I have reviewed the patients History and Physical, chart, labs and discussed the procedure including the risks, benefits and alternatives for the proposed anesthesia with the patient or authorized representative who has indicated his/her understanding and acceptance.  ? ? ? ?Dental advisory given ? ?Plan Discussed with: CRNA ? ?Anesthesia Plan Comments: (2 x PIV)  ? ? ? ? ? ?Anesthesia Quick Evaluation ? ?

## 2021-08-12 ENCOUNTER — Inpatient Hospital Stay (HOSPITAL_COMMUNITY): Payer: BC Managed Care – PPO | Admitting: Anesthesiology

## 2021-08-12 ENCOUNTER — Other Ambulatory Visit: Payer: Self-pay

## 2021-08-12 ENCOUNTER — Encounter (HOSPITAL_COMMUNITY)
Admission: RE | Disposition: A | Discharge status: Home / Self Care | Payer: Self-pay | Source: Ambulatory Visit | Attending: Neurological Surgery

## 2021-08-12 ENCOUNTER — Inpatient Hospital Stay (HOSPITAL_COMMUNITY)
Admission: RE | Admit: 2021-08-12 | Discharge: 2021-08-13 | DRG: 460 | Disposition: A | Discharge status: Home / Self Care | Payer: BC Managed Care – PPO | Source: Ambulatory Visit | Attending: Neurological Surgery | Admitting: Neurological Surgery

## 2021-08-12 ENCOUNTER — Inpatient Hospital Stay (HOSPITAL_COMMUNITY): Payer: BC Managed Care – PPO | Admitting: Physician Assistant

## 2021-08-12 ENCOUNTER — Inpatient Hospital Stay (HOSPITAL_COMMUNITY): Payer: BC Managed Care – PPO

## 2021-08-12 ENCOUNTER — Encounter (HOSPITAL_COMMUNITY): Payer: Self-pay | Admitting: Neurological Surgery

## 2021-08-12 DIAGNOSIS — S22069A Unspecified fracture of T7-T8 vertebra, initial encounter for closed fracture: Principal | ICD-10-CM | POA: Diagnosis present

## 2021-08-12 DIAGNOSIS — S22009A Unspecified fracture of unspecified thoracic vertebra, initial encounter for closed fracture: Principal | ICD-10-CM | POA: Diagnosis present

## 2021-08-12 DIAGNOSIS — Y92410 Unspecified street and highway as the place of occurrence of the external cause: Secondary | ICD-10-CM | POA: Diagnosis not present

## 2021-08-12 DIAGNOSIS — Z87442 Personal history of urinary calculi: Secondary | ICD-10-CM | POA: Diagnosis not present

## 2021-08-12 LAB — ABO/RH: ABO/RH(D): O POS

## 2021-08-12 SURGERY — POSTERIOR LUMBAR FUSION 2 LEVEL
Anesthesia: General

## 2021-08-12 MED ORDER — 0.9 % SODIUM CHLORIDE (POUR BTL) OPTIME
TOPICAL | Status: DC | PRN
  Administered 2021-08-12: 1000 mL

## 2021-08-12 MED ORDER — THROMBIN 5000 UNITS EX SOLR
CUTANEOUS | Status: AC
  Filled 2021-08-12: qty 5000

## 2021-08-12 MED ORDER — OXYCODONE HCL 5 MG PO TABS
10.0000 mg | ORAL_TABLET | ORAL | Status: DC | PRN
  Administered 2021-08-12 – 2021-08-13 (×6): 10 mg via ORAL
  Filled 2021-08-12 (×6): qty 2

## 2021-08-12 MED ORDER — ONDANSETRON HCL 4 MG PO TABS: 4.0000 mg | ORAL_TABLET | Freq: Four times a day (QID) | ORAL | Status: DC | PRN

## 2021-08-12 MED ORDER — DEXAMETHASONE SODIUM PHOSPHATE 10 MG/ML IJ SOLN
INTRAMUSCULAR | Status: AC
  Filled 2021-08-12: qty 1

## 2021-08-12 MED ORDER — SODIUM CHLORIDE 0.9% FLUSH
3.0000 mL | Freq: Two times a day (BID) | INTRAVENOUS | Status: DC
  Administered 2021-08-12: 3 mL via INTRAVENOUS

## 2021-08-12 MED ORDER — MENTHOL 3 MG MT LOZG: 1.0000 | LOZENGE | OROMUCOSAL | Status: DC | PRN

## 2021-08-12 MED ORDER — DEXMEDETOMIDINE (PRECEDEX) IN NS 20 MCG/5ML (4 MCG/ML) IV SYRINGE
PREFILLED_SYRINGE | INTRAVENOUS | Status: DC | PRN
  Administered 2021-08-12: 32 ug via INTRAVENOUS
  Administered 2021-08-12: 8 ug via INTRAVENOUS

## 2021-08-12 MED ORDER — ROCURONIUM BROMIDE 10 MG/ML (PF) SYRINGE
PREFILLED_SYRINGE | INTRAVENOUS | Status: AC
  Filled 2021-08-12: qty 10

## 2021-08-12 MED ORDER — LIDOCAINE-EPINEPHRINE 1 %-1:100000 IJ SOLN
INTRAMUSCULAR | Status: AC
  Filled 2021-08-12: qty 1

## 2021-08-12 MED ORDER — KETAMINE HCL 50 MG/5ML IJ SOSY
PREFILLED_SYRINGE | INTRAMUSCULAR | Status: AC
  Filled 2021-08-12: qty 10

## 2021-08-12 MED ORDER — ROCURONIUM BROMIDE 10 MG/ML (PF) SYRINGE
PREFILLED_SYRINGE | INTRAVENOUS | Status: DC | PRN
  Administered 2021-08-12: 20 mg via INTRAVENOUS
  Administered 2021-08-12: 80 mg via INTRAVENOUS
  Administered 2021-08-12: 20 mg via INTRAVENOUS

## 2021-08-12 MED ORDER — CHLORHEXIDINE GLUCONATE CLOTH 2 % EX PADS
6.0000 | MEDICATED_PAD | Freq: Once | CUTANEOUS | Status: DC
Start: 2021-08-12 — End: 2021-08-12

## 2021-08-12 MED ORDER — OXYCODONE HCL 5 MG PO TABS: 5.0000 mg | ORAL_TABLET | ORAL | Status: DC | PRN

## 2021-08-12 MED ORDER — LIDOCAINE 2% (20 MG/ML) 5 ML SYRINGE
INTRAMUSCULAR | Status: DC | PRN
  Administered 2021-08-12: 100 mg via INTRAVENOUS

## 2021-08-12 MED ORDER — LACTATED RINGERS IV SOLN: INTRAVENOUS | Status: DC

## 2021-08-12 MED ORDER — PHENOL 1.4 % MT LIQD: 1.0000 | OROMUCOSAL | Status: DC | PRN

## 2021-08-12 MED ORDER — THROMBIN 5000 UNITS EX SOLR
OROMUCOSAL | Status: DC | PRN
  Administered 2021-08-12: 5 mL via TOPICAL

## 2021-08-12 MED ORDER — CEFAZOLIN IN SODIUM CHLORIDE 3-0.9 GM/100ML-% IV SOLN
3.0000 g | INTRAVENOUS | Status: AC
  Administered 2021-08-12: 3 g via INTRAVENOUS
  Filled 2021-08-12: qty 100

## 2021-08-12 MED ORDER — AMISULPRIDE (ANTIEMETIC) 5 MG/2ML IV SOLN: 10.0000 mg | Freq: Once | INTRAVENOUS | Status: DC | PRN

## 2021-08-12 MED ORDER — DEXAMETHASONE SODIUM PHOSPHATE 10 MG/ML IJ SOLN
INTRAMUSCULAR | Status: DC | PRN
  Administered 2021-08-12: 10 mg via INTRAVENOUS

## 2021-08-12 MED ORDER — DEXMEDETOMIDINE (PRECEDEX) IN NS 20 MCG/5ML (4 MCG/ML) IV SYRINGE
PREFILLED_SYRINGE | INTRAVENOUS | Status: AC
  Filled 2021-08-12: qty 10

## 2021-08-12 MED ORDER — KETAMINE HCL 10 MG/ML IJ SOLN
INTRAMUSCULAR | Status: DC | PRN
  Administered 2021-08-12 (×2): 10 mg via INTRAVENOUS
  Administered 2021-08-12: 50 mg via INTRAVENOUS

## 2021-08-12 MED ORDER — ACETAMINOPHEN 325 MG PO TABS
650.0000 mg | ORAL_TABLET | ORAL | Status: DC | PRN
  Administered 2021-08-12 – 2021-08-13 (×3): 650 mg via ORAL
  Filled 2021-08-12 (×3): qty 2

## 2021-08-12 MED ORDER — CHLORHEXIDINE GLUCONATE 0.12 % MT SOLN
15.0000 mL | Freq: Once | OROMUCOSAL | Status: AC
  Administered 2021-08-12: 15 mL via OROMUCOSAL
  Filled 2021-08-12: qty 15

## 2021-08-12 MED ORDER — ONDANSETRON HCL 4 MG/2ML IJ SOLN: 4.0000 mg | Freq: Four times a day (QID) | INTRAMUSCULAR | Status: DC | PRN

## 2021-08-12 MED ORDER — MIDAZOLAM HCL 2 MG/2ML IJ SOLN
INTRAMUSCULAR | Status: AC
  Filled 2021-08-12: qty 2

## 2021-08-12 MED ORDER — LACTATED RINGERS IV SOLN: INTRAVENOUS | Status: DC | PRN

## 2021-08-12 MED ORDER — PROPOFOL 10 MG/ML IV BOLUS
INTRAVENOUS | Status: AC
  Filled 2021-08-12: qty 20

## 2021-08-12 MED ORDER — GLYCOPYRROLATE PF 0.2 MG/ML IJ SOSY
PREFILLED_SYRINGE | INTRAMUSCULAR | Status: DC | PRN
  Administered 2021-08-12: .1 mg via INTRAVENOUS

## 2021-08-12 MED ORDER — FENTANYL CITRATE (PF) 250 MCG/5ML IJ SOLN
INTRAMUSCULAR | Status: AC
  Filled 2021-08-12: qty 5

## 2021-08-12 MED ORDER — LIDOCAINE 2% (20 MG/ML) 5 ML SYRINGE
INTRAMUSCULAR | Status: AC
  Filled 2021-08-12: qty 5

## 2021-08-12 MED ORDER — HYDROMORPHONE HCL 1 MG/ML IJ SOLN
INTRAMUSCULAR | Status: AC
  Filled 2021-08-12: qty 1

## 2021-08-12 MED ORDER — CHLORHEXIDINE GLUCONATE CLOTH 2 % EX PADS: 6.0000 | MEDICATED_PAD | Freq: Once | CUTANEOUS | Status: DC

## 2021-08-12 MED ORDER — LIDOCAINE-EPINEPHRINE 1 %-1:100000 IJ SOLN
INTRAMUSCULAR | Status: DC | PRN
  Administered 2021-08-12: 10 mL

## 2021-08-12 MED ORDER — POLYETHYLENE GLYCOL 3350 17 G PO PACK: 17.0000 g | PACK | Freq: Every day | ORAL | Status: DC | PRN

## 2021-08-12 MED ORDER — MIDAZOLAM HCL 2 MG/2ML IJ SOLN
INTRAMUSCULAR | Status: DC | PRN
  Administered 2021-08-12: 2 mg via INTRAVENOUS

## 2021-08-12 MED ORDER — BUPIVACAINE HCL (PF) 0.5 % IJ SOLN
INTRAMUSCULAR | Status: AC
  Filled 2021-08-12: qty 30

## 2021-08-12 MED ORDER — SODIUM CHLORIDE 0.9% FLUSH: 3.0000 mL | INTRAVENOUS | Status: DC | PRN

## 2021-08-12 MED ORDER — ONDANSETRON HCL 4 MG/2ML IJ SOLN
INTRAMUSCULAR | Status: AC
  Filled 2021-08-12: qty 2

## 2021-08-12 MED ORDER — GABAPENTIN 300 MG PO CAPS
300.0000 mg | ORAL_CAPSULE | Freq: Once | ORAL | Status: AC
  Administered 2021-08-12: 300 mg via ORAL
  Filled 2021-08-12: qty 1

## 2021-08-12 MED ORDER — HYDROMORPHONE HCL 1 MG/ML IJ SOLN: 1.0000 mg | INTRAMUSCULAR | Status: DC | PRN

## 2021-08-12 MED ORDER — FENTANYL CITRATE (PF) 250 MCG/5ML IJ SOLN
INTRAMUSCULAR | Status: DC | PRN
  Administered 2021-08-12: 150 ug via INTRAVENOUS
  Administered 2021-08-12: 100 ug via INTRAVENOUS

## 2021-08-12 MED ORDER — PROPOFOL 10 MG/ML IV BOLUS
INTRAVENOUS | Status: DC | PRN
  Administered 2021-08-12: 50 mg via INTRAVENOUS
  Administered 2021-08-12: 200 mg via INTRAVENOUS

## 2021-08-12 MED ORDER — HYDROMORPHONE HCL 1 MG/ML IJ SOLN
0.2500 mg | INTRAMUSCULAR | Status: DC | PRN
  Administered 2021-08-12: 0.5 mg via INTRAVENOUS

## 2021-08-12 MED ORDER — GLYCOPYRROLATE PF 0.2 MG/ML IJ SOSY
PREFILLED_SYRINGE | INTRAMUSCULAR | Status: AC
  Filled 2021-08-12: qty 1

## 2021-08-12 MED ORDER — CYCLOBENZAPRINE HCL 10 MG PO TABS
10.0000 mg | ORAL_TABLET | Freq: Three times a day (TID) | ORAL | Status: DC | PRN
  Administered 2021-08-12 – 2021-08-13 (×3): 10 mg via ORAL
  Filled 2021-08-12 (×3): qty 1

## 2021-08-12 MED ORDER — SODIUM CHLORIDE 0.9 % IV SOLN: 250.0000 mL | INTRAVENOUS | Status: DC

## 2021-08-12 MED ORDER — ORAL CARE MOUTH RINSE: 15.0000 mL | Freq: Once | OROMUCOSAL | Status: AC

## 2021-08-12 MED ORDER — ONDANSETRON HCL 4 MG/2ML IJ SOLN
INTRAMUSCULAR | Status: DC | PRN
  Administered 2021-08-12: 4 mg via INTRAVENOUS

## 2021-08-12 MED ORDER — DOCUSATE SODIUM 100 MG PO CAPS
100.0000 mg | ORAL_CAPSULE | Freq: Two times a day (BID) | ORAL | Status: DC
  Administered 2021-08-12 – 2021-08-13 (×3): 100 mg via ORAL
  Filled 2021-08-12 (×3): qty 1

## 2021-08-12 MED ORDER — CEFAZOLIN SODIUM-DEXTROSE 2-4 GM/100ML-% IV SOLN
2.0000 g | Freq: Three times a day (TID) | INTRAVENOUS | Status: AC
  Administered 2021-08-12 (×2): 2 g via INTRAVENOUS
  Filled 2021-08-12 (×2): qty 100

## 2021-08-12 MED ORDER — ACETAMINOPHEN 650 MG RE SUPP: 650.0000 mg | RECTAL | Status: DC | PRN

## 2021-08-12 MED ORDER — ACETAMINOPHEN 500 MG PO TABS
1000.0000 mg | ORAL_TABLET | Freq: Once | ORAL | Status: AC
  Administered 2021-08-12: 1000 mg via ORAL
  Filled 2021-08-12: qty 2

## 2021-08-12 SURGICAL SUPPLY — 68 items
BAG COUNTER SPONGE SURGICOUNT (BAG) ×3 IMPLANT
BASKET BONE COLLECTION (BASKET) ×2 IMPLANT
BENZOIN TINCTURE PRP APPL 2/3 (GAUZE/BANDAGES/DRESSINGS) IMPLANT
BLADE CLIPPER SURG (BLADE) IMPLANT
BLADE SURG 11 STRL SS (BLADE) ×2 IMPLANT
BUR MATCHSTICK NEURO 3.0 LAGG (BURR) ×2 IMPLANT
BUR PRECISION FLUTE 5.0 (BURR) ×2 IMPLANT
CANISTER SUCT 3000ML PPV (MISCELLANEOUS) ×2 IMPLANT
CNTNR URN SCR LID CUP LEK RST (MISCELLANEOUS) ×1 IMPLANT
CONT SPEC 4OZ STRL OR WHT (MISCELLANEOUS) ×1
COVER BACK TABLE 60X90IN (DRAPES) ×2 IMPLANT
DECANTER SPIKE VIAL GLASS SM (MISCELLANEOUS) ×2 IMPLANT
DERMABOND ADHESIVE PROPEN (GAUZE/BANDAGES/DRESSINGS) ×1
DERMABOND ADVANCED (GAUZE/BANDAGES/DRESSINGS) ×1
DERMABOND ADVANCED .7 DNX12 (GAUZE/BANDAGES/DRESSINGS) ×1 IMPLANT
DERMABOND ADVANCED .7 DNX6 (GAUZE/BANDAGES/DRESSINGS) IMPLANT
DRAPE C-ARM 42X72 X-RAY (DRAPES) ×1 IMPLANT
DRAPE C-ARMOR (DRAPES) ×1 IMPLANT
DRAPE LAPAROTOMY 100X72X124 (DRAPES) ×2 IMPLANT
DRAPE SURG 17X23 STRL (DRAPES) ×2 IMPLANT
DURAPREP 26ML APPLICATOR (WOUND CARE) ×2 IMPLANT
ELECT REM PT RETURN 9FT ADLT (ELECTROSURGICAL) ×2
ELECTRODE REM PT RTRN 9FT ADLT (ELECTROSURGICAL) ×1 IMPLANT
GAUZE 4X4 16PLY ~~LOC~~+RFID DBL (SPONGE) ×1 IMPLANT
GAUZE SPONGE 4X4 12PLY STRL (GAUZE/BANDAGES/DRESSINGS) IMPLANT
GLOVE BIOGEL PI IND STRL 7.5 (GLOVE) ×2 IMPLANT
GLOVE BIOGEL PI INDICATOR 7.5 (GLOVE) ×2
GLOVE ECLIPSE 7.5 STRL STRAW (GLOVE) ×4 IMPLANT
GLOVE EXAM NITRILE LRG STRL (GLOVE) IMPLANT
GLOVE EXAM NITRILE XL STR (GLOVE) IMPLANT
GLOVE EXAM NITRILE XS STR PU (GLOVE) IMPLANT
GOWN STRL REUS W/ TWL LRG LVL3 (GOWN DISPOSABLE) ×4 IMPLANT
GOWN STRL REUS W/ TWL XL LVL3 (GOWN DISPOSABLE) IMPLANT
GOWN STRL REUS W/TWL 2XL LVL3 (GOWN DISPOSABLE) IMPLANT
GOWN STRL REUS W/TWL LRG LVL3 (GOWN DISPOSABLE) ×4
GOWN STRL REUS W/TWL XL LVL3 (GOWN DISPOSABLE)
GRAFT BN 5X1XSPNE CVD POST DBM (Bone Implant) IMPLANT
GRAFT BONE MAGNIFUSE 1X5CM (Bone Implant) ×1 IMPLANT
HEMOSTAT POWDER KIT SURGIFOAM (HEMOSTASIS) ×2 IMPLANT
KIT BASIN OR (CUSTOM PROCEDURE TRAY) ×2 IMPLANT
KIT POSITION SURG JACKSON T1 (MISCELLANEOUS) ×2 IMPLANT
KIT TURNOVER KIT B (KITS) ×2 IMPLANT
MILL MEDIUM DISP (BLADE) ×2 IMPLANT
NDL HYPO 18GX1.5 BLUNT FILL (NEEDLE) IMPLANT
NDL SPNL 18GX3.5 QUINCKE PK (NEEDLE) IMPLANT
NEEDLE HYPO 18GX1.5 BLUNT FILL (NEEDLE) IMPLANT
NEEDLE HYPO 22GX1.5 SAFETY (NEEDLE) ×2 IMPLANT
NEEDLE SPNL 18GX3.5 QUINCKE PK (NEEDLE) IMPLANT
NS IRRIG 1000ML POUR BTL (IV SOLUTION) ×2 IMPLANT
PACK LAMINECTOMY NEURO (CUSTOM PROCEDURE TRAY) ×2 IMPLANT
PAD ARMBOARD 7.5X6 YLW CONV (MISCELLANEOUS) ×6 IMPLANT
ROD PED SOLERA 5.5X60 (Rod) ×2 IMPLANT
SCREW LUM MA SOLERA 5.5X35 (Screw) ×2 IMPLANT
SCREW SET SOLERA (Screw) ×6 IMPLANT
SCREW SET SOLERA TI5.5 (Screw) IMPLANT
SCREW SPINAL 4.5X35MM COBALT (Screw) ×4 IMPLANT
SPONGE SURGIFOAM ABS GEL 100 (HEMOSTASIS) IMPLANT
SPONGE T-LAP 4X18 ~~LOC~~+RFID (SPONGE) ×2 IMPLANT
STRIP CLOSURE SKIN 1/2X4 (GAUZE/BANDAGES/DRESSINGS) IMPLANT
SUT MNCRL AB 3-0 PS2 18 (SUTURE) ×2 IMPLANT
SUT VIC AB 0 CT1 18XCR BRD8 (SUTURE) ×1 IMPLANT
SUT VIC AB 0 CT1 8-18 (SUTURE) ×1
SUT VIC AB 2-0 CP2 18 (SUTURE) ×3 IMPLANT
SYR 3ML LL SCALE MARK (SYRINGE) IMPLANT
TOWEL GREEN STERILE (TOWEL DISPOSABLE) ×2 IMPLANT
TOWEL GREEN STERILE FF (TOWEL DISPOSABLE) ×2 IMPLANT
TRAY FOLEY MTR SLVR 16FR STAT (SET/KITS/TRAYS/PACK) ×2 IMPLANT
WATER STERILE IRR 1000ML POUR (IV SOLUTION) ×2 IMPLANT

## 2021-08-12 NOTE — Anesthesia Procedure Notes (Signed)
Procedure Name: Intubation ?Date/Time: 08/12/2021 7:50 AM ?Performed by: Janace Litten, CRNA ?Pre-anesthesia Checklist: Patient identified, Emergency Drugs available, Suction available and Patient being monitored ?Patient Re-evaluated:Patient Re-evaluated prior to induction ?Oxygen Delivery Method: Circle System Utilized ?Preoxygenation: Pre-oxygenation with 100% oxygen ?Induction Type: IV induction ?Ventilation: Mask ventilation without difficulty ?Laryngoscope Size: Mac and 4 ?Grade View: Grade I ?Tube type: Oral ?Tube size: 7.5 mm ?Number of attempts: 1 ?Airway Equipment and Method: Stylet ?Placement Confirmation: ETT inserted through vocal cords under direct vision, positive ETCO2 and breath sounds checked- equal and bilateral ?Secured at: 23 cm ?Tube secured with: Tape ?Dental Injury: Teeth and Oropharynx as per pre-operative assessment  ? ? ? ? ?
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
## 2021-08-12 NOTE — Anesthesia Postprocedure Evaluation (Signed)
Anesthesia Post Note ? ?Patient: Kyle Eaton ? ?Procedure(s) Performed: Thoracic Seven to Thoracic Nine posterior spinal instrumented fusion ? ?  ? ?Patient location during evaluation: PACU ?Anesthesia Type: General ?Level of consciousness: sedated and patient cooperative ?Pain management: pain level controlled ?Vital Signs Assessment: post-procedure vital signs reviewed and stable ?Respiratory status: spontaneous breathing ?Cardiovascular status: stable ?Anesthetic complications: no ? ? ?No notable events documented. ? ?Last Vitals:  ?Vitals:  ? 08/12/21 1150 08/12/21 1215  ?BP: 129/89 139/83  ?Pulse: (!) 58 63  ?Resp: 11 18  ?Temp: (!) 36.3 ?C   ?SpO2: 96% 100%  ?  ?Last Pain:  ?Vitals:  ? 08/12/21 1305  ?TempSrc:   ?PainSc: 8   ? ? ?  ?  ?  ?  ?  ?  ? ?Nolon Nations ? ? ? ? ?

## 2021-08-12 NOTE — Transfer of Care (Signed)
Immediate Anesthesia Transfer of Care Note ? ?Patient: Kyle Eaton ? ?Procedure(s) Performed: Thoracic Seven to Thoracic Nine posterior spinal instrumented fusion ? ?Patient Location: PACU ? ?Anesthesia Type:General ? ?Level of Consciousness: drowsy, patient cooperative and responds to stimulation ? ?Airway & Oxygen Therapy: Patient Spontanous Breathing and Patient connected to nasal cannula oxygen ? ?Post-op Assessment: Report given to RN and Post -op Vital signs reviewed and stable ? ?Post vital signs: Reviewed and stable ? ?Last Vitals:  ?Vitals Value Taken Time  ?BP 114/68 08/12/21 1052  ?Temp    ?Pulse 86 08/12/21 1054  ?Resp 10 08/12/21 1054  ?SpO2 99 % 08/12/21 1054  ?Vitals shown include unvalidated device data. ? ?Last Pain:  ?Vitals:  ? 08/12/21 0613  ?TempSrc:   ?PainSc: 2   ?   ? ?Patients Stated Pain Goal: 0 (08/12/21 3419) ? ?Complications: No notable events documented. ?

## 2021-08-13 MED ORDER — METHOCARBAMOL 500 MG PO TABS
500.0000 mg | ORAL_TABLET | Freq: Four times a day (QID) | ORAL | 0 refills | Status: AC
Start: 2021-08-13 — End: ?

## 2021-08-13 MED ORDER — OXYCODONE-ACETAMINOPHEN 5-325 MG PO TABS: 1.0000 | ORAL_TABLET | ORAL | 0 refills | Status: AC | PRN

## 2021-08-13 NOTE — Discharge Summary (Signed)
Physician Discharge Summary  ?Patient ID: ?Kyle Eaton ?MRN: [id number] ?DOB/AGE: 1975-11-05 46 y.o. ? ?Admit date: 08/12/2021 ?Discharge date: 08/13/2021 ? ?Admission Diagnoses: Thoracic spine fracture ?  ? ? ?Discharge Diagnoses: same ? ? ?Discharged Condition: good ? ?Hospital Course: The patient was admitted on 08/12/2021 and taken to the operating room where the patient underwent T7-T9 posterior instrumented fusion. The patient tolerated the procedure well and was taken to the recovery room and then to the floor in stable condition. The hospital course was routine. There were no complications. The wound remained clean dry and intact. Pt had appropriate back soreness. No complaints of leg pain or new N/T/W. The patient remained afebrile with stable vital signs, and tolerated a regular diet. The patient continued to increase activities, and pain was well controlled with oral pain medications.  ? ?Consults: None ? ?Significant Diagnostic Studies:  ?Results for orders placed or performed during the hospital encounter of 08/12/21  ?ABO/Rh  ?Result Value Ref Range  ? ABO/RH(D)    ?  O POS ?Performed at [facility], [address], [place], [place] [address] ?  ? ? ?DG Thoracic Spine 2 View ? ?Result Date: 08/12/2021 ?CLINICAL DATA:  Fluoroscopic views during thoracic posterior spinal fusion. EXAM: THORACIC SPINE 2 VIEWS COMPARISON:  None. FINDINGS: Fluoroscopic images for T7-T9 posterior spinal fusion. Total fluoroscopic time was 1 minutes and 32 seconds. Total fluoroscopic dose was 52.25 mGy. IMPRESSION: Intraoperative utilization of fluoroscopy. Electronically Signed   By: Larose Hires D.O.   On: 08/12/2021 11:21  ? ?DG C-Arm 1-60 Min-No Report ? ?Result Date: 08/12/2021 ?Fluoroscopy was utilized by the requesting physician.  No radiographic interpretation.  ? ?DG C-Arm 1-60 Min-No Report ? ?Result Date: 08/12/2021 ?Fluoroscopy was utilized by the requesting physician.  No radiographic interpretation.    ? ?Antibiotics:  ?Anti-infectives (From admission, onward)  ? ? Start     Dose/Rate Route Frequency Ordered Stop  ? 08/12/21 1630  ceFAZolin (ANCEF) IVPB 2g/100 mL premix       ? 2 g ?200 mL/hr over 30 Minutes Intravenous Every 8 hours 08/12/21 1212 08/12/21 2336  ? 08/12/21 0600  ceFAZolin (ANCEF) IVPB 3g/100 mL premix       ? 3 g ?200 mL/hr over 30 Minutes Intravenous On call to O.R. 08/12/21 0093 08/12/21 0829  ? ?  ? ? ?Discharge Exam: ?Blood pressure 124/70, pulse 73, temperature 98.5 ?F (36.9 ?C), temperature source Oral, resp. rate 18, height 5\' 10"  (1.778 m), weight 118.4 kg, SpO2 99 %. ?Neurologic: Grossly normal ?Ambulating and voiding well incision cdi  ? ?Discharge Medications:   ?Allergies as of 08/13/2021   ?No Known Allergies ?  ? ?  ?Medication List  ?  ? ?TAKE these medications   ? ?methocarbamol 500 MG tablet ?Commonly known as: Robaxin ?Take 1 tablet (500 mg total) by mouth 4 (four) times daily. ?  ?oxyCODONE-acetaminophen 5-325 MG tablet ?Commonly known as: Percocet ?Take 1 tablet by mouth every 4 (four) hours as needed for severe pain. ?  ? ?  ? ? ?Disposition: home  ? ?Final Dx: posterior instrumented fusion T7-T9 ? ?Discharge Instructions   ? ? Call MD for:  difficulty breathing, headache or visual disturbances   Complete by: As directed ?  ? Call MD for:  hives   Complete by: As directed ?  ? Call MD for:  persistant dizziness or light-headedness   Complete by: As directed ?  ? Call MD for:  persistant nausea and vomiting  Complete by: As directed ?  ? Call MD for:  redness, tenderness, or signs of infection (pain, swelling, redness, odor or green/yellow discharge around incision site)   Complete by: As directed ?  ? Call MD for:  severe uncontrolled pain   Complete by: As directed ?  ? Call MD for:  temperature >100.4   Complete by: As directed ?  ? Diet - low sodium heart healthy   Complete by: As directed ?  ? Driving Restrictions   Complete by: As directed ?  ? No driving for 2 weeks, no  riding in the car for 1 week  ? Increase activity slowly   Complete by: As directed ?  ? Lifting restrictions   Complete by: As directed ?  ? No lifting more than 8 lbs  ? No wound care   Complete by: As directed ?  ? ?  ? ? ? ? ? ?Signed: ?Tiana Loft Annaly Skop ?08/13/2021, 2:04 PM ?  ?

## 2021-08-13 NOTE — Progress Notes (Signed)
Patient alert and oriented, mae's well, voiding adequate amount of urine, swallowing without difficulty, no c/o pain at time of discharge. Patient discharged home with family. Script and discharged instructions given to patient. Patient and family stated understanding of instructions given. Patient has an appointment with Dr. Ostergard in 2 weeks 
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
## 2024-02-23 ENCOUNTER — Other Ambulatory Visit (HOSPITAL_BASED_OUTPATIENT_CLINIC_OR_DEPARTMENT_OTHER): Payer: Self-pay

## 2024-02-23 MED ORDER — COMIRNATY 30 MCG/0.3ML IM SUSY
0.3000 mL | PREFILLED_SYRINGE | Freq: Once | INTRAMUSCULAR | 0 refills | Status: AC
  Filled 2024-02-23: qty 0.3, 1d supply, fill #0

## 2024-02-23 MED ORDER — FLUZONE 0.5 ML IM SUSY
0.5000 mL | PREFILLED_SYRINGE | Freq: Once | INTRAMUSCULAR | 0 refills | Status: AC
  Filled 2024-02-23: qty 0.5, 1d supply, fill #0
